# Patient Record
Sex: Female | Born: 1955 | Race: White | Hispanic: No | Marital: Single | Smoking: Never smoker
Health system: Southern US, Community
[De-identification: ages and names within clinical notes are randomized; demographics above are authoritative.]

## PROBLEM LIST (undated history)

## (undated) DIAGNOSIS — R32 Unspecified urinary incontinence: Secondary | ICD-10-CM

## (undated) DIAGNOSIS — Z853 Personal history of malignant neoplasm of breast: Secondary | ICD-10-CM

## (undated) DIAGNOSIS — E559 Vitamin D deficiency, unspecified: Secondary | ICD-10-CM

## (undated) DIAGNOSIS — E7800 Pure hypercholesterolemia, unspecified: Secondary | ICD-10-CM

## (undated) DIAGNOSIS — K219 Gastro-esophageal reflux disease without esophagitis: Secondary | ICD-10-CM

## (undated) DIAGNOSIS — J309 Allergic rhinitis, unspecified: Secondary | ICD-10-CM

## (undated) DIAGNOSIS — C801 Malignant (primary) neoplasm, unspecified: Secondary | ICD-10-CM

## (undated) DIAGNOSIS — M8580 Other specified disorders of bone density and structure, unspecified site: Secondary | ICD-10-CM

## (undated) DIAGNOSIS — L719 Rosacea, unspecified: Secondary | ICD-10-CM

## (undated) DIAGNOSIS — Z923 Personal history of irradiation: Secondary | ICD-10-CM

## (undated) DIAGNOSIS — D219 Benign neoplasm of connective and other soft tissue, unspecified: Secondary | ICD-10-CM

## (undated) DIAGNOSIS — Z780 Asymptomatic menopausal state: Secondary | ICD-10-CM

## (undated) DIAGNOSIS — D369 Benign neoplasm, unspecified site: Secondary | ICD-10-CM

## (undated) DIAGNOSIS — G540 Brachial plexus disorders: Secondary | ICD-10-CM

## (undated) DIAGNOSIS — N83201 Unspecified ovarian cyst, right side: Secondary | ICD-10-CM

## (undated) HISTORY — PX: WISDOM TOOTH EXTRACTION: SHX21

## (undated) HISTORY — DX: Asymptomatic menopausal state: Z78.0

## (undated) HISTORY — DX: Benign neoplasm, unspecified site: D36.9

## (undated) HISTORY — DX: Unspecified ovarian cyst, right side: N83.201

## (undated) HISTORY — DX: Personal history of malignant neoplasm of breast: Z85.3

## (undated) HISTORY — DX: Allergic rhinitis, unspecified: J30.9

## (undated) HISTORY — DX: Rosacea, unspecified: L71.9

## (undated) HISTORY — DX: Other specified disorders of bone density and structure, unspecified site: M85.80

## (undated) HISTORY — DX: Unspecified urinary incontinence: R32

## (undated) HISTORY — DX: Malignant (primary) neoplasm, unspecified: C80.1

## (undated) HISTORY — DX: Pure hypercholesterolemia, unspecified: E78.00

## (undated) HISTORY — DX: Gastro-esophageal reflux disease without esophagitis: K21.9

## (undated) HISTORY — DX: Brachial plexus disorders: G54.0

## (undated) HISTORY — DX: Vitamin D deficiency, unspecified: E55.9

## (undated) HISTORY — DX: Benign neoplasm of connective and other soft tissue, unspecified: D21.9

---

## 2001-11-07 DIAGNOSIS — Z9221 Personal history of antineoplastic chemotherapy: Secondary | ICD-10-CM

## 2001-11-07 DIAGNOSIS — C50919 Malignant neoplasm of unspecified site of unspecified female breast: Secondary | ICD-10-CM

## 2001-11-07 DIAGNOSIS — Z923 Personal history of irradiation: Secondary | ICD-10-CM

## 2001-11-07 HISTORY — DX: Malignant neoplasm of unspecified site of unspecified female breast: C50.919

## 2001-11-07 HISTORY — PX: BREAST LUMPECTOMY: SHX2

## 2001-11-07 HISTORY — DX: Personal history of antineoplastic chemotherapy: Z92.21

## 2001-11-07 HISTORY — DX: Personal history of irradiation: Z92.3

## 2002-06-19 ENCOUNTER — Other Ambulatory Visit: Admission: RE | Admit: 2002-06-19 | Discharge: 2002-06-19 | Payer: Self-pay | Admitting: Radiology

## 2002-06-19 ENCOUNTER — Encounter (INDEPENDENT_AMBULATORY_CARE_PROVIDER_SITE_OTHER): Payer: Self-pay | Admitting: *Deleted

## 2002-06-19 ENCOUNTER — Encounter: Payer: Self-pay | Admitting: Family Medicine

## 2002-06-19 ENCOUNTER — Encounter: Admission: RE | Admit: 2002-06-19 | Discharge: 2002-06-19 | Payer: Self-pay | Admitting: Family Medicine

## 2002-06-19 DIAGNOSIS — Z853 Personal history of malignant neoplasm of breast: Secondary | ICD-10-CM

## 2002-06-19 HISTORY — DX: Personal history of malignant neoplasm of breast: Z85.3

## 2002-07-05 ENCOUNTER — Encounter: Admission: RE | Admit: 2002-07-05 | Discharge: 2002-07-05 | Payer: Self-pay | Admitting: Surgery

## 2002-07-05 ENCOUNTER — Encounter: Payer: Self-pay | Admitting: Surgery

## 2002-07-09 ENCOUNTER — Encounter (INDEPENDENT_AMBULATORY_CARE_PROVIDER_SITE_OTHER): Payer: Self-pay | Admitting: Specialist

## 2002-07-09 ENCOUNTER — Ambulatory Visit (HOSPITAL_BASED_OUTPATIENT_CLINIC_OR_DEPARTMENT_OTHER): Admission: RE | Admit: 2002-07-09 | Discharge: 2002-07-10 | Payer: Self-pay | Admitting: Surgery

## 2002-07-09 ENCOUNTER — Encounter: Payer: Self-pay | Admitting: Surgery

## 2002-07-09 HISTORY — PX: MASTECTOMY PARTIAL / LUMPECTOMY W/ AXILLARY LYMPHADENECTOMY: SUR852

## 2002-07-10 ENCOUNTER — Ambulatory Visit: Admission: RE | Admit: 2002-07-10 | Discharge: 2002-07-29 | Payer: Self-pay | Admitting: Radiation Oncology

## 2002-07-25 ENCOUNTER — Ambulatory Visit (HOSPITAL_BASED_OUTPATIENT_CLINIC_OR_DEPARTMENT_OTHER): Admission: RE | Admit: 2002-07-25 | Discharge: 2002-07-25 | Payer: Self-pay | Admitting: Surgery

## 2002-07-25 ENCOUNTER — Encounter: Payer: Self-pay | Admitting: Surgery

## 2002-07-26 ENCOUNTER — Ambulatory Visit (HOSPITAL_COMMUNITY): Admission: RE | Admit: 2002-07-26 | Discharge: 2002-07-26 | Payer: Self-pay | Admitting: Oncology

## 2002-07-26 ENCOUNTER — Encounter: Payer: Self-pay | Admitting: Oncology

## 2002-07-30 ENCOUNTER — Encounter: Payer: Self-pay | Admitting: Oncology

## 2002-07-30 ENCOUNTER — Ambulatory Visit (HOSPITAL_COMMUNITY): Admission: RE | Admit: 2002-07-30 | Discharge: 2002-07-30 | Payer: Self-pay | Admitting: Oncology

## 2002-11-29 ENCOUNTER — Ambulatory Visit: Admission: RE | Admit: 2002-11-29 | Discharge: 2003-02-06 | Payer: Self-pay | Admitting: Radiation Oncology

## 2002-12-06 ENCOUNTER — Encounter: Admission: RE | Admit: 2002-12-06 | Discharge: 2002-12-06 | Payer: Self-pay | Admitting: Radiation Oncology

## 2003-06-24 ENCOUNTER — Encounter: Admission: RE | Admit: 2003-06-24 | Discharge: 2003-06-24 | Payer: Self-pay | Admitting: Oncology

## 2003-06-24 ENCOUNTER — Encounter: Payer: Self-pay | Admitting: Oncology

## 2003-08-25 ENCOUNTER — Ambulatory Visit (HOSPITAL_COMMUNITY): Admission: RE | Admit: 2003-08-25 | Discharge: 2003-08-25 | Payer: Self-pay | Admitting: Oncology

## 2003-08-25 ENCOUNTER — Encounter: Payer: Self-pay | Admitting: Oncology

## 2003-11-06 ENCOUNTER — Ambulatory Visit (HOSPITAL_COMMUNITY): Admission: RE | Admit: 2003-11-06 | Discharge: 2003-11-06 | Payer: Self-pay | Admitting: Oncology

## 2003-12-01 ENCOUNTER — Ambulatory Visit (HOSPITAL_COMMUNITY): Admission: RE | Admit: 2003-12-01 | Discharge: 2003-12-01 | Payer: Self-pay | Admitting: Oncology

## 2004-01-01 ENCOUNTER — Ambulatory Visit (HOSPITAL_BASED_OUTPATIENT_CLINIC_OR_DEPARTMENT_OTHER): Admission: RE | Admit: 2004-01-01 | Discharge: 2004-01-01 | Payer: Self-pay | Admitting: Surgery

## 2004-01-01 HISTORY — PX: PORT-A-CATH REMOVAL: SHX5289

## 2004-01-20 ENCOUNTER — Encounter: Admission: RE | Admit: 2004-01-20 | Discharge: 2004-01-20 | Payer: Self-pay | Admitting: Surgery

## 2004-05-19 ENCOUNTER — Ambulatory Visit (HOSPITAL_COMMUNITY): Admission: RE | Admit: 2004-05-19 | Discharge: 2004-05-19 | Payer: Self-pay | Admitting: Oncology

## 2004-06-24 ENCOUNTER — Encounter: Admission: RE | Admit: 2004-06-24 | Discharge: 2004-06-24 | Payer: Self-pay | Admitting: Oncology

## 2004-07-15 ENCOUNTER — Ambulatory Visit (HOSPITAL_COMMUNITY): Admission: RE | Admit: 2004-07-15 | Discharge: 2004-07-15 | Payer: Self-pay | Admitting: Oncology

## 2004-08-23 ENCOUNTER — Ambulatory Visit (HOSPITAL_COMMUNITY): Admission: RE | Admit: 2004-08-23 | Discharge: 2004-08-23 | Payer: Self-pay | Admitting: Obstetrics and Gynecology

## 2004-09-16 ENCOUNTER — Ambulatory Visit: Payer: Self-pay | Admitting: Oncology

## 2004-10-04 ENCOUNTER — Other Ambulatory Visit: Admission: RE | Admit: 2004-10-04 | Discharge: 2004-10-04 | Payer: Self-pay | Admitting: Obstetrics and Gynecology

## 2004-12-22 ENCOUNTER — Ambulatory Visit: Payer: Self-pay | Admitting: Oncology

## 2004-12-23 ENCOUNTER — Ambulatory Visit: Payer: Self-pay | Admitting: Oncology

## 2005-05-02 ENCOUNTER — Ambulatory Visit (HOSPITAL_COMMUNITY): Admission: RE | Admit: 2005-05-02 | Discharge: 2005-05-02 | Payer: Self-pay | Admitting: Obstetrics and Gynecology

## 2005-06-24 ENCOUNTER — Ambulatory Visit: Payer: Self-pay | Admitting: Oncology

## 2005-06-27 ENCOUNTER — Encounter: Admission: RE | Admit: 2005-06-27 | Discharge: 2005-06-27 | Payer: Self-pay | Admitting: Oncology

## 2005-06-28 ENCOUNTER — Ambulatory Visit (HOSPITAL_COMMUNITY): Admission: RE | Admit: 2005-06-28 | Discharge: 2005-06-28 | Payer: Self-pay | Admitting: Oncology

## 2005-07-27 ENCOUNTER — Encounter: Admission: RE | Admit: 2005-07-27 | Discharge: 2005-07-27 | Payer: Self-pay | Admitting: Oncology

## 2005-11-30 ENCOUNTER — Ambulatory Visit (HOSPITAL_COMMUNITY): Admission: RE | Admit: 2005-11-30 | Discharge: 2005-11-30 | Payer: Self-pay | Admitting: Obstetrics and Gynecology

## 2005-12-14 ENCOUNTER — Other Ambulatory Visit: Admission: RE | Admit: 2005-12-14 | Discharge: 2005-12-14 | Payer: Self-pay | Admitting: Obstetrics and Gynecology

## 2005-12-22 ENCOUNTER — Ambulatory Visit: Payer: Self-pay | Admitting: Oncology

## 2006-01-24 ENCOUNTER — Encounter: Admission: RE | Admit: 2006-01-24 | Discharge: 2006-01-24 | Payer: Self-pay | Admitting: Oncology

## 2006-06-23 ENCOUNTER — Ambulatory Visit (HOSPITAL_COMMUNITY): Admission: RE | Admit: 2006-06-23 | Discharge: 2006-06-23 | Payer: Self-pay | Admitting: Oncology

## 2006-06-28 ENCOUNTER — Encounter: Admission: RE | Admit: 2006-06-28 | Discharge: 2006-06-28 | Payer: Self-pay | Admitting: Oncology

## 2006-07-12 ENCOUNTER — Ambulatory Visit: Payer: Self-pay | Admitting: Oncology

## 2006-07-14 LAB — COMPREHENSIVE METABOLIC PANEL
BUN: 18 mg/dL (ref 6–23)
CO2: 28 mEq/L (ref 19–32)
Calcium: 9.1 mg/dL (ref 8.4–10.5)
Chloride: 99 mEq/L (ref 96–112)
Creatinine, Ser: 1.01 mg/dL (ref 0.40–1.20)
Glucose, Bld: 78 mg/dL (ref 70–99)
Total Bilirubin: 1 mg/dL (ref 0.3–1.2)

## 2006-07-14 LAB — CBC WITH DIFFERENTIAL/PLATELET
Eosinophils Absolute: 0.1 10*3/uL (ref 0.0–0.5)
MONO#: 0.5 10*3/uL (ref 0.1–0.9)
MONO%: 7.7 % (ref 0.0–13.0)
NEUT#: 4.2 10*3/uL (ref 1.5–6.5)
RBC: 4.37 10*6/uL (ref 3.70–5.32)
RDW: 12.3 % (ref 11.3–14.5)
WBC: 6.7 10*3/uL (ref 3.9–10.0)
lymph#: 1.9 10*3/uL (ref 0.9–3.3)

## 2006-07-14 LAB — LACTATE DEHYDROGENASE: LDH: 153 U/L (ref 94–250)

## 2006-07-14 LAB — CANCER ANTIGEN 27.29: CA 27.29: 23 U/mL (ref 0–39)

## 2006-07-17 ENCOUNTER — Ambulatory Visit (HOSPITAL_COMMUNITY): Admission: RE | Admit: 2006-07-17 | Discharge: 2006-07-17 | Payer: Self-pay | Admitting: Oncology

## 2006-12-26 ENCOUNTER — Other Ambulatory Visit: Admission: RE | Admit: 2006-12-26 | Discharge: 2006-12-26 | Payer: Self-pay | Admitting: Obstetrics and Gynecology

## 2007-01-17 ENCOUNTER — Ambulatory Visit: Payer: Self-pay | Admitting: Oncology

## 2007-01-22 LAB — COMPREHENSIVE METABOLIC PANEL
ALT: 14 U/L (ref 0–35)
AST: 23 U/L (ref 0–37)
Albumin: 4.4 g/dL (ref 3.5–5.2)
BUN: 15 mg/dL (ref 6–23)
CO2: 26 mEq/L (ref 19–32)
Calcium: 9.1 mg/dL (ref 8.4–10.5)
Chloride: 103 mEq/L (ref 96–112)
Creatinine, Ser: 0.96 mg/dL (ref 0.40–1.20)
Potassium: 3.9 mEq/L (ref 3.5–5.3)

## 2007-01-22 LAB — CBC WITH DIFFERENTIAL/PLATELET
BASO%: 0.4 % (ref 0.0–2.0)
Basophils Absolute: 0 10*3/uL (ref 0.0–0.1)
EOS%: 1.6 % (ref 0.0–7.0)
HCT: 37.2 % (ref 34.8–46.6)
HGB: 12.9 g/dL (ref 11.6–15.9)
MONO#: 0.5 10*3/uL (ref 0.1–0.9)
NEUT%: 59.6 % (ref 39.6–76.8)
RDW: 12.9 % (ref 11.3–14.5)
WBC: 5.5 10*3/uL (ref 3.9–10.0)
lymph#: 1.6 10*3/uL (ref 0.9–3.3)

## 2007-01-22 LAB — CANCER ANTIGEN 27.29: CA 27.29: 24 U/mL (ref 0–39)

## 2007-01-22 LAB — LACTATE DEHYDROGENASE: LDH: 153 U/L (ref 94–250)

## 2007-07-02 ENCOUNTER — Encounter: Admission: RE | Admit: 2007-07-02 | Discharge: 2007-07-02 | Payer: Self-pay | Admitting: Oncology

## 2007-07-17 ENCOUNTER — Ambulatory Visit (HOSPITAL_COMMUNITY): Admission: RE | Admit: 2007-07-17 | Discharge: 2007-07-17 | Payer: Self-pay | Admitting: Oncology

## 2007-07-23 ENCOUNTER — Ambulatory Visit: Payer: Self-pay | Admitting: Oncology

## 2007-07-25 LAB — CBC WITH DIFFERENTIAL/PLATELET
Basophils Absolute: 0 10*3/uL (ref 0.0–0.1)
EOS%: 1.8 % (ref 0.0–7.0)
HCT: 37.1 % (ref 34.8–46.6)
HGB: 13.2 g/dL (ref 11.6–15.9)
LYMPH%: 30 % (ref 14.0–48.0)
MCH: 31.2 pg (ref 26.0–34.0)
MONO#: 0.4 10*3/uL (ref 0.1–0.9)
NEUT%: 59.5 % (ref 39.6–76.8)
Platelets: 161 10*3/uL (ref 145–400)
lymph#: 1.6 10*3/uL (ref 0.9–3.3)

## 2007-07-25 LAB — COMPREHENSIVE METABOLIC PANEL
Albumin: 4.3 g/dL (ref 3.5–5.2)
BUN: 19 mg/dL (ref 6–23)
Calcium: 9.1 mg/dL (ref 8.4–10.5)
Chloride: 104 mEq/L (ref 96–112)
Glucose, Bld: 113 mg/dL — ABNORMAL HIGH (ref 70–99)
Potassium: 4.1 mEq/L (ref 3.5–5.3)

## 2007-10-17 ENCOUNTER — Encounter: Admission: RE | Admit: 2007-10-17 | Discharge: 2007-11-07 | Payer: Self-pay | Admitting: Family Medicine

## 2007-11-09 ENCOUNTER — Encounter: Admission: RE | Admit: 2007-11-09 | Discharge: 2007-12-17 | Payer: Self-pay | Admitting: Family Medicine

## 2008-01-22 ENCOUNTER — Ambulatory Visit: Payer: Self-pay | Admitting: Oncology

## 2008-01-24 LAB — CBC WITH DIFFERENTIAL/PLATELET
Eosinophils Absolute: 0.1 10*3/uL (ref 0.0–0.5)
MCHC: 35.2 g/dL (ref 32.0–36.0)
MCV: 88.3 fL (ref 81.0–101.0)
MONO%: 6.9 % (ref 0.0–13.0)
NEUT#: 3 10*3/uL (ref 1.5–6.5)
RBC: 4.46 10*6/uL (ref 3.70–5.32)
RDW: 12.7 % (ref 11.3–14.5)
WBC: 5 10*3/uL (ref 3.9–10.0)

## 2008-01-24 LAB — COMPREHENSIVE METABOLIC PANEL
BUN: 15 mg/dL (ref 6–23)
CO2: 25 mEq/L (ref 19–32)
Calcium: 9.4 mg/dL (ref 8.4–10.5)
Chloride: 103 mEq/L (ref 96–112)
Creatinine, Ser: 0.82 mg/dL (ref 0.40–1.20)
Total Bilirubin: 0.8 mg/dL (ref 0.3–1.2)

## 2008-01-24 LAB — LACTATE DEHYDROGENASE: LDH: 168 U/L (ref 94–250)

## 2008-01-24 LAB — CANCER ANTIGEN 27.29: CA 27.29: 24 U/mL (ref 0–39)

## 2008-02-04 ENCOUNTER — Other Ambulatory Visit: Admission: RE | Admit: 2008-02-04 | Discharge: 2008-02-04 | Payer: Self-pay | Admitting: Obstetrics and Gynecology

## 2008-07-02 ENCOUNTER — Encounter: Admission: RE | Admit: 2008-07-02 | Discharge: 2008-07-02 | Payer: Self-pay | Admitting: Oncology

## 2008-12-01 ENCOUNTER — Encounter: Admission: RE | Admit: 2008-12-01 | Discharge: 2008-12-31 | Payer: Self-pay | Admitting: Family Medicine

## 2009-01-21 ENCOUNTER — Ambulatory Visit: Payer: Self-pay | Admitting: Oncology

## 2009-01-23 ENCOUNTER — Ambulatory Visit (HOSPITAL_COMMUNITY): Admission: RE | Admit: 2009-01-23 | Discharge: 2009-01-23 | Payer: Self-pay | Admitting: Oncology

## 2009-01-23 LAB — CBC WITH DIFFERENTIAL/PLATELET
BASO%: 0.3 % (ref 0.0–2.0)
Basophils Absolute: 0 10*3/uL (ref 0.0–0.1)
EOS%: 1.1 % (ref 0.0–7.0)
HCT: 40.5 % (ref 34.8–46.6)
HGB: 13.7 g/dL (ref 11.6–15.9)
MCH: 30.5 pg (ref 25.1–34.0)
MCHC: 33.8 g/dL (ref 31.5–36.0)
MONO#: 0.5 10*3/uL (ref 0.1–0.9)
RDW: 12.5 % (ref 11.2–14.5)
WBC: 6.3 10*3/uL (ref 3.9–10.3)
lymph#: 1.1 10*3/uL (ref 0.9–3.3)

## 2009-01-26 LAB — COMPREHENSIVE METABOLIC PANEL
ALT: 15 U/L (ref 0–35)
AST: 24 U/L (ref 0–37)
Albumin: 4.6 g/dL (ref 3.5–5.2)
CO2: 27 mEq/L (ref 19–32)
Calcium: 9.3 mg/dL (ref 8.4–10.5)
Chloride: 103 mEq/L (ref 96–112)
Potassium: 3.7 mEq/L (ref 3.5–5.3)

## 2009-01-26 LAB — LACTATE DEHYDROGENASE: LDH: 174 U/L (ref 94–250)

## 2009-07-03 ENCOUNTER — Encounter: Admission: RE | Admit: 2009-07-03 | Discharge: 2009-07-03 | Payer: Self-pay | Admitting: Oncology

## 2009-07-15 ENCOUNTER — Other Ambulatory Visit: Admission: RE | Admit: 2009-07-15 | Discharge: 2009-07-15 | Payer: Self-pay | Admitting: Obstetrics and Gynecology

## 2009-08-05 ENCOUNTER — Ambulatory Visit: Payer: Self-pay | Admitting: Oncology

## 2009-08-07 LAB — CBC WITH DIFFERENTIAL/PLATELET
EOS%: 1.6 % (ref 0.0–7.0)
HGB: 13.9 g/dL (ref 11.6–15.9)
MCHC: 34.8 g/dL (ref 31.5–36.0)
MCV: 89.7 fL (ref 79.5–101.0)
MONO#: 0.5 10*3/uL (ref 0.1–0.9)
MONO%: 6.7 % (ref 0.0–14.0)
NEUT#: 4.5 10*3/uL (ref 1.5–6.5)
NEUT%: 66 % (ref 38.4–76.8)
RBC: 4.47 10*6/uL (ref 3.70–5.45)
WBC: 6.8 10*3/uL (ref 3.9–10.3)
lymph#: 1.7 10*3/uL (ref 0.9–3.3)

## 2009-08-08 LAB — COMPREHENSIVE METABOLIC PANEL
AST: 21 U/L (ref 0–37)
Alkaline Phosphatase: 71 U/L (ref 39–117)
CO2: 26 mEq/L (ref 19–32)
Creatinine, Ser: 1.03 mg/dL (ref 0.40–1.20)
Glucose, Bld: 81 mg/dL (ref 70–99)
Potassium: 3.8 mEq/L (ref 3.5–5.3)
Sodium: 140 mEq/L (ref 135–145)
Total Bilirubin: 0.7 mg/dL (ref 0.3–1.2)

## 2009-08-08 LAB — LACTATE DEHYDROGENASE: LDH: 167 U/L (ref 94–250)

## 2009-08-08 LAB — VITAMIN D 25 HYDROXY (VIT D DEFICIENCY, FRACTURES): Vit D, 25-Hydroxy: 42 ng/mL (ref 30–89)

## 2010-07-06 ENCOUNTER — Encounter: Admission: RE | Admit: 2010-07-06 | Discharge: 2010-07-06 | Payer: Self-pay | Admitting: Oncology

## 2010-08-05 ENCOUNTER — Ambulatory Visit: Payer: Self-pay | Admitting: Oncology

## 2010-08-09 LAB — CBC WITH DIFFERENTIAL/PLATELET
EOS%: 1.4 % (ref 0.0–7.0)
Eosinophils Absolute: 0.1 10*3/uL (ref 0.0–0.5)
HCT: 38.9 % (ref 34.8–46.6)
HGB: 13.4 g/dL (ref 11.6–15.9)
MCH: 31.5 pg (ref 25.1–34.0)
MONO#: 0.6 10*3/uL (ref 0.1–0.9)
Platelets: 157 10*3/uL (ref 145–400)
RDW: 12.4 % (ref 11.2–14.5)
lymph#: 1.9 10*3/uL (ref 0.9–3.3)

## 2010-08-10 LAB — COMPREHENSIVE METABOLIC PANEL
ALT: 18 U/L (ref 0–35)
AST: 25 U/L (ref 0–37)
Albumin: 4.6 g/dL (ref 3.5–5.2)
Alkaline Phosphatase: 63 U/L (ref 39–117)
BUN: 16 mg/dL (ref 6–23)
Sodium: 137 mEq/L (ref 135–145)
Total Bilirubin: 0.6 mg/dL (ref 0.3–1.2)

## 2010-11-27 ENCOUNTER — Other Ambulatory Visit: Payer: Self-pay | Admitting: Oncology

## 2010-11-27 DIAGNOSIS — Z0000 Encounter for general adult medical examination without abnormal findings: Secondary | ICD-10-CM

## 2010-11-28 ENCOUNTER — Encounter: Payer: Self-pay | Admitting: Oncology

## 2011-03-25 NOTE — Op Note (Signed)
NAME:  Allison Hernandez, Allison Hernandez                          ACCOUNT NO.:  [id number]   MEDICAL RECORD NO.:  [id number]                   PATIENT TYPE:  AMB   LOCATION:  DSC                                  FACILITY:  [facility]   PHYSICIAN:  Currie Paris, M.D.           DATE OF BIRTH:  15-Mar-1956   DATE OF PROCEDURE:  01/01/2004  DATE OF DISCHARGE:                                 OPERATIVE REPORT   PREOPERATIVE DIAGNOSIS:  Unneeded Port-a-Cath.   POSTOPERATIVE DIAGNOSIS:  Unneeded Port-a-Cath.   PROCEDURE:  Aspiration and removal of Port-a-Cath.   SURGEON:  Currie Paris, M.D.   ANESTHESIA:  Local.   CLINICAL HISTORY:  This patient is a 55 year old lady who has now completed  her chemo and wished to have her Port-a-Cath removed.   DESCRIPTION OF PROCEDURE:  The patient was seen in minor procedure room.  She had no further questions about the surgery.  The area over the Port-a-  Cath was prepped with an alcohol sponge and anesthetized with 1% Xylocaine  with epi using about 10 cc.  We waited about 10 minutes and then prepped the  area with Betadine.  The old scar was opened and the Port-a-Cath reservoir  identified and the holding sutures cut.  Port-a-Cath reservoir was  manipulated out of its pocket.  A figure-of-eight Vicryl was placed around  the Port-a-Cath tubing and the tubing was pulled out and the figure-of-eight  suture tied down.   The incision was then closed in layers with 3-0 Vicryl followed by 4-0  Monocryl subcuticular and Dermabond.  Patient tolerated the procedure well.  All counts were correct.                                               Currie Paris, M.D.    CJS/MEDQ  D:  01/01/2004  T:  01/01/2004  Job:  [id number]

## 2011-04-28 ENCOUNTER — Other Ambulatory Visit (HOSPITAL_COMMUNITY)
Admission: RE | Admit: 2011-04-28 | Discharge: 2011-04-28 | Disposition: A | Discharge status: Home / Self Care | Payer: BC Managed Care – PPO | Source: Ambulatory Visit | Attending: Obstetrics and Gynecology | Admitting: Obstetrics and Gynecology

## 2011-04-28 ENCOUNTER — Other Ambulatory Visit: Payer: Self-pay | Admitting: Obstetrics and Gynecology

## 2011-04-28 DIAGNOSIS — Z01419 Encounter for gynecological examination (general) (routine) without abnormal findings: Secondary | ICD-10-CM | POA: Insufficient documentation

## 2011-07-19 ENCOUNTER — Ambulatory Visit
Admission: RE | Admit: 2011-07-19 | Discharge: 2011-07-19 | Disposition: A | Discharge status: Home / Self Care | Payer: BC Managed Care – PPO | Source: Ambulatory Visit | Attending: Oncology | Admitting: Oncology

## 2011-07-19 DIAGNOSIS — Z0000 Encounter for general adult medical examination without abnormal findings: Secondary | ICD-10-CM

## 2011-08-24 ENCOUNTER — Other Ambulatory Visit: Payer: Self-pay | Admitting: Oncology

## 2011-08-24 ENCOUNTER — Encounter (HOSPITAL_BASED_OUTPATIENT_CLINIC_OR_DEPARTMENT_OTHER): Payer: BC Managed Care – PPO | Admitting: Oncology

## 2011-08-24 DIAGNOSIS — Z171 Estrogen receptor negative status [ER-]: Secondary | ICD-10-CM

## 2011-08-24 DIAGNOSIS — C50419 Malignant neoplasm of upper-outer quadrant of unspecified female breast: Secondary | ICD-10-CM

## 2011-08-24 LAB — CBC WITH DIFFERENTIAL/PLATELET
EOS%: 1 % (ref 0.0–7.0)
Eosinophils Absolute: 0.1 10*3/uL (ref 0.0–0.5)
LYMPH%: 25.8 % (ref 14.0–49.7)
MCH: 30.9 pg (ref 25.1–34.0)
MCV: 90.6 fL (ref 79.5–101.0)
MONO%: 7.3 % (ref 0.0–14.0)
NEUT#: 4.4 10*3/uL (ref 1.5–6.5)
Platelets: 142 10*3/uL — ABNORMAL LOW (ref 145–400)
RBC: 4.32 10*6/uL (ref 3.70–5.45)
RDW: 12.9 % (ref 11.2–14.5)

## 2011-08-24 LAB — COMPREHENSIVE METABOLIC PANEL
AST: 23 U/L (ref 0–37)
Alkaline Phosphatase: 59 U/L (ref 39–117)
BUN: 18 mg/dL (ref 6–23)
Glucose, Bld: 95 mg/dL (ref 70–99)
Potassium: 4 mEq/L (ref 3.5–5.3)
Sodium: 138 mEq/L (ref 135–145)
Total Bilirubin: 0.6 mg/dL (ref 0.3–1.2)
Total Protein: 6.8 g/dL (ref 6.0–8.3)

## 2011-08-26 ENCOUNTER — Encounter (HOSPITAL_BASED_OUTPATIENT_CLINIC_OR_DEPARTMENT_OTHER): Payer: BC Managed Care – PPO | Admitting: Oncology

## 2011-08-26 ENCOUNTER — Other Ambulatory Visit: Payer: Self-pay | Admitting: Oncology

## 2011-08-26 DIAGNOSIS — Z1231 Encounter for screening mammogram for malignant neoplasm of breast: Secondary | ICD-10-CM

## 2011-08-26 DIAGNOSIS — C50419 Malignant neoplasm of upper-outer quadrant of unspecified female breast: Secondary | ICD-10-CM

## 2011-08-26 DIAGNOSIS — Z171 Estrogen receptor negative status [ER-]: Secondary | ICD-10-CM

## 2011-09-03 ENCOUNTER — Encounter (INDEPENDENT_AMBULATORY_CARE_PROVIDER_SITE_OTHER): Payer: Self-pay | Admitting: Surgery

## 2012-07-19 ENCOUNTER — Ambulatory Visit
Admission: RE | Admit: 2012-07-19 | Discharge: 2012-07-19 | Disposition: A | Discharge status: Home / Self Care | Payer: BC Managed Care – PPO | Source: Ambulatory Visit | Attending: Oncology | Admitting: Oncology

## 2012-07-19 DIAGNOSIS — Z1231 Encounter for screening mammogram for malignant neoplasm of breast: Secondary | ICD-10-CM

## 2012-08-15 ENCOUNTER — Ambulatory Visit (INDEPENDENT_AMBULATORY_CARE_PROVIDER_SITE_OTHER): Payer: BC Managed Care – PPO | Admitting: Internal Medicine

## 2012-08-15 ENCOUNTER — Encounter: Payer: Self-pay | Admitting: Internal Medicine

## 2012-08-15 VITALS — BP 122/80 | HR 96 | Temp 98.0°F | Resp 18 | Wt 128.0 lb

## 2012-08-15 DIAGNOSIS — N3941 Urge incontinence: Secondary | ICD-10-CM

## 2012-08-15 DIAGNOSIS — Z8639 Personal history of other endocrine, nutritional and metabolic disease: Secondary | ICD-10-CM

## 2012-08-15 DIAGNOSIS — G5760 Lesion of plantar nerve, unspecified lower limb: Secondary | ICD-10-CM

## 2012-08-15 DIAGNOSIS — C50919 Malignant neoplasm of unspecified site of unspecified female breast: Secondary | ICD-10-CM

## 2012-08-15 DIAGNOSIS — Z780 Asymptomatic menopausal state: Secondary | ICD-10-CM

## 2012-08-15 DIAGNOSIS — M949 Disorder of cartilage, unspecified: Secondary | ICD-10-CM

## 2012-08-15 DIAGNOSIS — Z8669 Personal history of other diseases of the nervous system and sense organs: Secondary | ICD-10-CM

## 2012-08-15 DIAGNOSIS — L719 Rosacea, unspecified: Secondary | ICD-10-CM

## 2012-08-15 DIAGNOSIS — M8580 Other specified disorders of bone density and structure, unspecified site: Secondary | ICD-10-CM

## 2012-08-15 NOTE — Progress Notes (Signed)
  Subjective:    Patient ID: Allison Hernandez, female    DOB: [birth year], 56 y.o.   MRN: [id number]  HPI  New pt. Here for first visit.  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  Overall Nazarene is doing well .  She needs her Vitamin D level checked  Allergies  Allergen Reactions  . Chamomile Itching   Past Medical History  Diagnosis Date  . 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 Osteopenia   . Urinary incontinence   . Thoracic outlet syndrome   . Rosacea   . Vitamin D deficiency disease    Past Surgical History  Procedure Date  . Mastectomy partial / lumpectomy w/ axillary lymphadenectomy 07/09/2002    Left - Dr Jamey Ripa  . Port-a-cath removal 01/01/2004    Dr Jamey Ripa   History   Social History  . Marital Status: Single    Spouse Name: N/A    Number of Children: N/A  . Years of Education: N/A   Occupational History  . Not on file.   Social History Main Topics  . Smoking status: Never Smoker   . Smokeless tobacco: Never Used  . Alcohol Use: 0.6 oz/week    1 Glasses of wine per week  . Drug Use: No  . Sexually Active: No   Other Topics Concern  . Not on file   Social History Narrative  . No narrative on file   Family History  Problem Relation Age of Onset  . Hyperlipidemia Mother   . Heart disease Mother   . Diabetes Father   . Stroke Father   . Hypertension Father   . Cancer Paternal Aunt   . Stroke Maternal Grandmother   . Heart disease Maternal Grandmother   . Arthritis Maternal Grandmother   . Diabetes Maternal Grandmother    Patient Active Problem List  Diagnosis  . Hx Breast cancer, IDC, Left, Stage II, triple -   No current outpatient prescriptions on file prior to visit.      Review of Systems    see HPI Objective:   Physical Exam Physical Exam  Nursing note and vitals reviewed.    Constitutional: She is oriented to person, place, and time. She appears well-developed and well-nourished.  HENT:  Head: Normocephalic and atraumatic.  Cardiovascular: Normal rate and regular rhythm. Exam reveals no gallop and no friction rub.  No murmur heard.  Pulmonary/Chest: Breath sounds normal. She has no wheezes. She has no rales.  Neurological: She is alert and oriented to person, place, and time.  Skin: Skin is warm and dry.  Psychiatric: She has a normal mood and affect. Her behavior is normal.       Assessment & Plan:  History of vitamin D deficiency  Will check levels  Menopause  History of osteopenia  Counsled to take calcium 1200-1500 mg and vitamin D [phone] units  Rosacea  History of urge incontinence  History of Morton's neuroma

## 2012-08-16 ENCOUNTER — Encounter: Payer: Self-pay | Admitting: Internal Medicine

## 2012-08-16 ENCOUNTER — Ambulatory Visit: Payer: BC Managed Care – PPO | Admitting: Internal Medicine

## 2012-08-16 DIAGNOSIS — Z780 Asymptomatic menopausal state: Secondary | ICD-10-CM | POA: Insufficient documentation

## 2012-08-16 DIAGNOSIS — G5760 Lesion of plantar nerve, unspecified lower limb: Secondary | ICD-10-CM | POA: Insufficient documentation

## 2012-08-16 DIAGNOSIS — M8580 Other specified disorders of bone density and structure, unspecified site: Secondary | ICD-10-CM | POA: Insufficient documentation

## 2012-08-16 DIAGNOSIS — N3941 Urge incontinence: Secondary | ICD-10-CM | POA: Insufficient documentation

## 2012-08-16 DIAGNOSIS — Z8669 Personal history of other diseases of the nervous system and sense organs: Secondary | ICD-10-CM | POA: Insufficient documentation

## 2012-08-16 DIAGNOSIS — Z8639 Personal history of other endocrine, nutritional and metabolic disease: Secondary | ICD-10-CM | POA: Insufficient documentation

## 2012-08-16 LAB — CBC WITH DIFFERENTIAL/PLATELET
Basophils Absolute: 0 10*3/uL (ref 0.0–0.1)
Eosinophils Relative: 2 % (ref 0–5)
HCT: 41.7 % (ref 36.0–46.0)
Lymphocytes Relative: 29 % (ref 12–46)
Lymphs Abs: 1.5 10*3/uL (ref 0.7–4.0)
MCV: 88 fL (ref 78.0–100.0)
Neutro Abs: 3 10*3/uL (ref 1.7–7.7)
Platelets: 174 10*3/uL (ref 150–400)
RBC: 4.74 MIL/uL (ref 3.87–5.11)
WBC: 5.1 10*3/uL (ref 4.0–10.5)

## 2012-08-16 LAB — COMPREHENSIVE METABOLIC PANEL
ALT: 17 U/L (ref 0–35)
AST: 26 U/L (ref 0–37)
Albumin: 4.6 g/dL (ref 3.5–5.2)
CO2: 27 mEq/L (ref 19–32)
Calcium: 9.8 mg/dL (ref 8.4–10.5)
Chloride: 103 mEq/L (ref 96–112)
Creat: 0.97 mg/dL (ref 0.50–1.10)
Potassium: 4.4 mEq/L (ref 3.5–5.3)
Total Protein: 7.3 g/dL (ref 6.0–8.3)

## 2012-08-16 LAB — LIPID PANEL
Cholesterol: 233 mg/dL — ABNORMAL HIGH (ref 0–200)
Total CHOL/HDL Ratio: 3.2 Ratio

## 2012-08-16 LAB — TSH: TSH: 1.313 u[IU]/mL (ref 0.350–4.500)

## 2012-08-17 LAB — VITAMIN D 25 HYDROXY (VIT D DEFICIENCY, FRACTURES): Vit D, 25-Hydroxy: 62 ng/mL (ref 30–89)

## 2012-08-18 DIAGNOSIS — L719 Rosacea, unspecified: Secondary | ICD-10-CM | POA: Insufficient documentation

## 2012-08-22 ENCOUNTER — Encounter: Payer: Self-pay | Admitting: *Deleted

## 2012-08-28 ENCOUNTER — Telehealth: Payer: Self-pay | Admitting: *Deleted

## 2012-08-28 NOTE — Telephone Encounter (Signed)
Labs mailed to pt home address.

## 2012-11-06 ENCOUNTER — Other Ambulatory Visit (HOSPITAL_COMMUNITY)
Admission: RE | Admit: 2012-11-06 | Discharge: 2012-11-06 | Disposition: A | Discharge status: Home / Self Care | Payer: BC Managed Care – PPO | Source: Ambulatory Visit | Attending: Obstetrics and Gynecology | Admitting: Obstetrics and Gynecology

## 2012-11-06 ENCOUNTER — Other Ambulatory Visit: Payer: Self-pay | Admitting: Obstetrics and Gynecology

## 2012-11-06 DIAGNOSIS — Z01419 Encounter for gynecological examination (general) (routine) without abnormal findings: Secondary | ICD-10-CM | POA: Insufficient documentation

## 2012-11-27 ENCOUNTER — Encounter: Payer: Self-pay | Admitting: Internal Medicine

## 2012-11-27 ENCOUNTER — Ambulatory Visit (INDEPENDENT_AMBULATORY_CARE_PROVIDER_SITE_OTHER): Payer: BC Managed Care – PPO | Admitting: Internal Medicine

## 2012-11-27 VITALS — BP 105/63 | HR 81 | Temp 97.4°F | Resp 18 | Wt 124.0 lb

## 2012-11-27 DIAGNOSIS — Z139 Encounter for screening, unspecified: Secondary | ICD-10-CM

## 2012-11-27 DIAGNOSIS — M8580 Other specified disorders of bone density and structure, unspecified site: Secondary | ICD-10-CM

## 2012-11-27 DIAGNOSIS — K635 Polyp of colon: Secondary | ICD-10-CM | POA: Insufficient documentation

## 2012-11-27 DIAGNOSIS — Z853 Personal history of malignant neoplasm of breast: Secondary | ICD-10-CM

## 2012-11-27 DIAGNOSIS — Z780 Asymptomatic menopausal state: Secondary | ICD-10-CM

## 2012-11-27 DIAGNOSIS — M949 Disorder of cartilage, unspecified: Secondary | ICD-10-CM

## 2012-11-27 DIAGNOSIS — D126 Benign neoplasm of colon, unspecified: Secondary | ICD-10-CM

## 2012-11-27 DIAGNOSIS — E785 Hyperlipidemia, unspecified: Secondary | ICD-10-CM

## 2012-11-27 DIAGNOSIS — L719 Rosacea, unspecified: Secondary | ICD-10-CM

## 2012-11-27 DIAGNOSIS — Z8639 Personal history of other endocrine, nutritional and metabolic disease: Secondary | ICD-10-CM

## 2012-11-27 DIAGNOSIS — N3941 Urge incontinence: Secondary | ICD-10-CM

## 2012-11-27 LAB — POCT URINALYSIS DIPSTICK
Bilirubin, UA: NEGATIVE
Blood, UA: NEGATIVE
Glucose, UA: NEGATIVE
Leukocytes, UA: NEGATIVE
Nitrite, UA: NEGATIVE
Urobilinogen, UA: NEGATIVE

## 2012-11-27 NOTE — Patient Instructions (Addendum)
See me as needed 

## 2012-11-27 NOTE — Progress Notes (Signed)
Subjective:    Patient ID: Allison Hernandez, female    DOB: Aug 22, 1956, 57 y.o.   MRN: [id number]  HPI Allison Hernandez is here for CPE  Allergies  Allergen Reactions  . Chamomile Itching  . Benadryl (Diphenhydramine Hcl)    Past Medical History  Diagnosis Date  . 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 Osteopenia   . Urinary incontinence   . Thoracic outlet syndrome   . Rosacea   . Vitamin D deficiency disease    Past Surgical History  Procedure Date  . Mastectomy partial / lumpectomy w/ axillary lymphadenectomy 07/09/2002    Left - Dr Jamey Ripa  . Port-a-cath removal 01/01/2004    Dr Jamey Ripa   History   Social History  . Marital Status: Single    Spouse Name: N/A    Number of Children: N/A  . Years of Education: N/A   Occupational History  . Not on file.   Social History Main Topics  . Smoking status: Never Smoker   . Smokeless tobacco: Never Used  . Alcohol Use: 0.6 oz/week    1 Glasses of wine per week  . Drug Use: No  . Sexually Active: No   Other Topics Concern  . Not on file   Social History Narrative  . No narrative on file   Family History  Problem Relation Age of Onset  . Hyperlipidemia Mother   . Heart disease Mother   . Diabetes Father   . Stroke Father   . Hypertension Father   . Cancer Paternal Aunt   . Stroke Maternal Grandmother   . Heart disease Maternal Grandmother   . Arthritis Maternal Grandmother   . Diabetes Maternal Grandmother    Patient Active Problem List  Diagnosis  . Hx Breast cancer, IDC, Left, Stage II, triple -  . Menopause  . Osteopenia  . Urge incontinence  . History of thoracic outlet syndrome  . Morton's neuroma  . History of vitamin D deficiency  . Rosacea   Current Outpatient Prescriptions on File Prior to Visit  Medication Sig Dispense Refill  . MULTIPLE VITAMIN PO Take 1 tablet by mouth.      . Sulfacetamide in Bakuchiol (SODIUM SULFACETAMIDE W[place] EX) Apply topically.      . Sulfur 5 % LOTN  Apply topically.           Review of Systems     Objective:   Physical Exam  Physical Exam  Nursing note and vitals reviewed.  Constitutional: She is oriented to person, place, and time. She appears well-developed and well-nourished.  HENT:  Head: Normocephalic and atraumatic.  Right Ear: Tympanic membrane and ear canal normal. No drainage. Tympanic membrane is not injected and not erythematous.  Left Ear: Tympanic membrane and ear canal normal. No drainage. Tympanic membrane is not injected and not erythematous.  Nose: Nose normal. Right sinus exhibits no maxillary sinus tenderness and no frontal sinus tenderness. Left sinus exhibits no maxillary sinus tenderness and no frontal sinus tenderness.  Mouth/Throat: Oropharynx is clear and moist. No oral lesions. No oropharyngeal exudate.  Eyes: Conjunctivae and EOM are normal. Pupils are equal, round, and reactive to light.  Neck: Normal range of motion. Neck supple. No JVD present. Carotid bruit is not present. No mass and no thyromegaly present.  Cardiovascular: Normal rate, regular rhythm, S1 normal, S2 normal and intact distal pulses. Exam reveals no gallop and no friction rub.  No murmur heard.  Pulses:  Carotid pulses are  2+ on the right side, and 2+ on the left side.  Dorsalis pedis pulses are 2+ on the right side, and 2+ on the left side.  No carotid bruit. No LE edema  Pulmonary/Chest: Breath sounds normal. She has no wheezes. She has no rales. She exhibits no tenderness.  Abdominal: Soft. Bowel sounds are normal. She exhibits no distension and no mass. There is no hepatosplenomegaly. There is no tenderness. There is no CVA tenderness.  Musculoskeletal: Normal range of motion.  No active synovitis to joints.  Lymphadenopathy:  She has no cervical adenopathy.  She has no axillary adenopathy.  Right: No inguinal and no supraclavicular adenopathy present.  Left: No inguinal and no supraclavicular adenopathy present.    Neurological: She is alert and oriented to person, place, and time. She has normal strength and normal reflexes. She displays no tremor. No cranial nerve deficit or sensory deficit. Coordination and gait normal.  Skin: Skin is warm and dry. No rash noted. No cyanosis. Nails show no clubbing.  Psychiatric: She has a normal mood and affect. Her speech is normal and behavior is normal. Cognition and memory are normal.           Assessment & Plan:  Health Maintenance  See scanned HM sheet  UTD with vaccines  Zostavax info given.   Had colonoscopy Dr. Evette Cristal 2011 per pt.  She is on a 5 year schedule as she had polyps  Hyperlipidemia  Framingham risk  3.5%  Will recheck fasting lipids in am  EKG today  NSR no acute changes  Osteopenia  She declines Dexa now  Urge incontinence  She wishes no meds.  Offered referral to PT and she will thing about it  Menopause  Stage II breast CA  Her cousin is getting the BRCA test.  If negative pt does not wish test,  But she will call ffice if cousin tests positive  See me as needed

## 2012-11-29 LAB — LIPID PANEL
Cholesterol: 197 mg/dL (ref 0–200)
VLDL: 14 mg/dL (ref 0–40)

## 2012-12-06 ENCOUNTER — Encounter: Payer: Self-pay | Admitting: *Deleted

## 2013-07-02 ENCOUNTER — Other Ambulatory Visit: Payer: Self-pay

## 2013-07-02 ENCOUNTER — Telehealth: Payer: Self-pay | Admitting: *Deleted

## 2013-07-02 DIAGNOSIS — Z1231 Encounter for screening mammogram for malignant neoplasm of breast: Secondary | ICD-10-CM

## 2013-07-02 NOTE — Telephone Encounter (Signed)
Allison Hernandez would like your opinion of whether she should have a regular mammo or a 3-D mammo with her history.  She can be reached at [phone] (her work); she said we can leave a message if she does not answer.

## 2013-07-02 NOTE — Telephone Encounter (Signed)
Advised pt per Dr Constance Goltz that a 3 D mammogram is recommended for her also gave her the name and number for Dr Elmer Picker

## 2013-07-02 NOTE — Telephone Encounter (Signed)
[facility]  Call [place] and tell her that I recommend a 3-D mm.  She will likely have a higher co-pay depending on her insurance guidelines for this.

## 2013-07-31 ENCOUNTER — Ambulatory Visit
Admission: RE | Admit: 2013-07-31 | Discharge: 2013-07-31 | Disposition: A | Discharge status: Home / Self Care | Payer: BC Managed Care – PPO | Source: Ambulatory Visit

## 2013-07-31 DIAGNOSIS — Z1231 Encounter for screening mammogram for malignant neoplasm of breast: Secondary | ICD-10-CM

## 2013-08-16 ENCOUNTER — Ambulatory Visit (INDEPENDENT_AMBULATORY_CARE_PROVIDER_SITE_OTHER): Payer: BC Managed Care – PPO | Admitting: *Deleted

## 2013-08-16 ENCOUNTER — Ambulatory Visit: Payer: BC Managed Care – PPO

## 2013-08-16 DIAGNOSIS — Z23 Encounter for immunization: Secondary | ICD-10-CM

## 2013-08-16 NOTE — Progress Notes (Signed)
Patient ID: Allison Hernandez, female   DOB: 05-Mar-1956, 57 y.o.   MRN: [id number] [place] is here for her annual flu vaccination, she is feeling well today with no fevers.

## 2013-08-16 NOTE — Addendum Note (Signed)
Addended by: Mathews Robinsons on: 08/16/2013 09:28 AM   Modules accepted: Level of Service

## 2013-10-10 ENCOUNTER — Encounter (INDEPENDENT_AMBULATORY_CARE_PROVIDER_SITE_OTHER): Payer: BC Managed Care – PPO | Admitting: Ophthalmology

## 2013-10-10 DIAGNOSIS — H35719 Central serous chorioretinopathy, unspecified eye: Secondary | ICD-10-CM

## 2013-10-10 DIAGNOSIS — H43819 Vitreous degeneration, unspecified eye: Secondary | ICD-10-CM

## 2013-10-10 DIAGNOSIS — H35379 Puckering of macula, unspecified eye: Secondary | ICD-10-CM

## 2013-10-10 DIAGNOSIS — H35349 Macular cyst, hole, or pseudohole, unspecified eye: Secondary | ICD-10-CM

## 2013-11-20 ENCOUNTER — Telehealth: Payer: Self-pay | Admitting: *Deleted

## 2013-11-20 MED ORDER — OSELTAMIVIR PHOSPHATE 75 MG PO CAPS: ORAL_CAPSULE | ORAL | Status: DC

## 2013-11-20 NOTE — Telephone Encounter (Signed)
Needs Tamiflu.  A member in her household has tested positive.  [facility]

## 2014-02-19 ENCOUNTER — Ambulatory Visit (HOSPITAL_BASED_OUTPATIENT_CLINIC_OR_DEPARTMENT_OTHER)
Admission: RE | Admit: 2014-02-19 | Discharge: 2014-02-19 | Disposition: A | Discharge status: Home / Self Care | Payer: BC Managed Care – PPO | Source: Ambulatory Visit | Attending: Internal Medicine | Admitting: Internal Medicine

## 2014-02-19 ENCOUNTER — Encounter: Payer: Self-pay | Admitting: Internal Medicine

## 2014-02-19 ENCOUNTER — Ambulatory Visit (INDEPENDENT_AMBULATORY_CARE_PROVIDER_SITE_OTHER): Payer: BC Managed Care – PPO | Admitting: Internal Medicine

## 2014-02-19 VITALS — BP 122/65 | HR 102 | Temp 98.1°F | Resp 18 | Wt 129.0 lb

## 2014-02-19 DIAGNOSIS — R05 Cough: Secondary | ICD-10-CM

## 2014-02-19 DIAGNOSIS — R059 Cough, unspecified: Secondary | ICD-10-CM

## 2014-02-19 DIAGNOSIS — J984 Other disorders of lung: Secondary | ICD-10-CM | POA: Insufficient documentation

## 2014-02-19 MED ORDER — AZITHROMYCIN 250 MG PO TABS
ORAL_TABLET | ORAL | Status: DC
Start: 2014-02-19 — End: 2014-03-05

## 2014-02-19 MED ORDER — OSELTAMIVIR PHOSPHATE 75 MG PO CAPS: 75.0000 mg | ORAL_CAPSULE | Freq: Two times a day (BID) | ORAL | Status: DC

## 2014-02-19 NOTE — Progress Notes (Signed)
Subjective:    Patient ID: Allison Hernandez, female    DOB: Mar 24, 1956, 58 y.o.   MRN: [id number]  HPI  Haydn is here for acute visit.   Began with head cold and sore throat 10  Days ago  Felt better but then had cough, myalgias low grade fever 5 days ago.   She did have an influenza vaccine.   No chest pain  Cough productive at times of white mucous.  Feeling some better now.   Allergies  Allergen Reactions  . Chamomile Itching  . Benadryl [Diphenhydramine Hcl]    Past Medical History  Diagnosis Date  . 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 Osteopenia   . Urinary incontinence   . Thoracic outlet syndrome   . Rosacea   . Vitamin D deficiency disease    Past Surgical History  Procedure Laterality Date  . Mastectomy partial / lumpectomy w/ axillary lymphadenectomy  07/09/2002    Left - Dr Margot Chimes  . Port-a-cath removal  01/01/2004    Dr Margot Chimes   History   Social History  . Marital Status: Single    Spouse Name: N/A    Number of Children: N/A  . Years of Education: N/A   Occupational History  . Not on file.   Social History Main Topics  . Smoking status: Never Smoker   . Smokeless tobacco: Never Used  . Alcohol Use: 0.6 oz/week    1 Glasses of wine per week  . Drug Use: No  . Sexual Activity: No   Other Topics Concern  . Not on file   Social History Narrative  . No narrative on file   Family History  Problem Relation Age of Onset  . Hyperlipidemia Mother   . Heart disease Mother   . Diabetes Father   . Stroke Father   . Hypertension Father   . Cancer Paternal Aunt   . Stroke Maternal Grandmother   . Heart disease Maternal Grandmother   . Arthritis Maternal Grandmother   . Diabetes Maternal Grandmother    Patient Active Problem List   Diagnosis Date Noted  . Colon polyps 11/27/2012  . Rosacea 08/18/2012  . Menopause 08/16/2012  . Osteopenia 08/16/2012  . Urge incontinence 08/16/2012  . History of thoracic outlet syndrome  08/16/2012  . Morton's neuroma 08/16/2012  . History of vitamin D deficiency 08/16/2012  . Hx Breast cancer, IDC, Left, Stage II, 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   Current Outpatient Prescriptions on File Prior to Visit  Medication Sig Dispense Refill  . MULTIPLE VITAMIN PO Take 1 tablet by mouth.      . Sulfacetamide in Bakuchiol (SODIUM SULFACETAMIDE [place] EX) Apply topically.      . Sulfur 5 % LOTN Apply topically.       No current facility-administered medications on file prior to visit.      Review of Systems    See HPI Objective:   Physical Exam  Physical Exam  Nursing note and vitals reviewed.   Rapid flu positive Constitutional: She is oriented to person, place, and time. She appears well-developed and well-nourished. She is cooperative.  HENT:  Head: Normocephalic and atraumatic.  Nose: Mucosal edema present.  Eyes: Conjunctivae and EOM are normal. Pupils are equal, round, and reactive to light.  Neck: Neck supple.  Cardiovascular: Regular rhythm, normal heart sounds, intact distal pulses and normal pulses. Exam reveals no gallop and no friction rub.  No murmur heard.  Pulmonary/Chest: She has no  wheezes. She has rhonchi. She has no rales.  Neurological: She is alert and oriented to person, place, and time.  Skin: Skin is warm and dry. No abrasion, no bruising, no ecchymosis and no rash noted. No cyanosis. Nails show no clubbing.  Psychiatric: She has a normal mood and affect. Her speech is normal and behavior is normal.              Assessment & Plan:  Prolonged cough  CXR neg  She is flu positive but began 4-5 days ago.  She does not wish to take meds if she is getting better  I printed RX for Tamiflu  If any worsening  Bronchitis   Rx printed for Z-pak  She is to take only if worsening.   Cough:  Delsym  OTC    See me as needed

## 2014-02-19 NOTE — Patient Instructions (Signed)
See me if any worsening  

## 2014-03-04 ENCOUNTER — Emergency Department (HOSPITAL_BASED_OUTPATIENT_CLINIC_OR_DEPARTMENT_OTHER)
Admission: EM | Admit: 2014-03-04 | Discharge: 2014-03-04 | Disposition: A | Discharge status: Home / Self Care | Payer: BC Managed Care – PPO | Attending: Emergency Medicine | Admitting: Emergency Medicine

## 2014-03-04 ENCOUNTER — Encounter (HOSPITAL_BASED_OUTPATIENT_CLINIC_OR_DEPARTMENT_OTHER): Payer: Self-pay | Admitting: Emergency Medicine

## 2014-03-04 ENCOUNTER — Emergency Department (HOSPITAL_BASED_OUTPATIENT_CLINIC_OR_DEPARTMENT_OTHER): Payer: BC Managed Care – PPO

## 2014-03-04 DIAGNOSIS — Z872 Personal history of diseases of the skin and subcutaneous tissue: Secondary | ICD-10-CM | POA: Insufficient documentation

## 2014-03-04 DIAGNOSIS — Z862 Personal history of diseases of the blood and blood-forming organs and certain disorders involving the immune mechanism: Secondary | ICD-10-CM | POA: Insufficient documentation

## 2014-03-04 DIAGNOSIS — Z8639 Personal history of other endocrine, nutritional and metabolic disease: Secondary | ICD-10-CM | POA: Insufficient documentation

## 2014-03-04 DIAGNOSIS — Z8669 Personal history of other diseases of the nervous system and sense organs: Secondary | ICD-10-CM | POA: Insufficient documentation

## 2014-03-04 DIAGNOSIS — Z8739 Personal history of other diseases of the musculoskeletal system and connective tissue: Secondary | ICD-10-CM | POA: Insufficient documentation

## 2014-03-04 DIAGNOSIS — J329 Chronic sinusitis, unspecified: Secondary | ICD-10-CM | POA: Insufficient documentation

## 2014-03-04 DIAGNOSIS — Z79899 Other long term (current) drug therapy: Secondary | ICD-10-CM | POA: Insufficient documentation

## 2014-03-04 DIAGNOSIS — Z853 Personal history of malignant neoplasm of breast: Secondary | ICD-10-CM | POA: Insufficient documentation

## 2014-03-04 LAB — CBC WITH DIFFERENTIAL/PLATELET
BASOS ABS: 0 10*3/uL (ref 0.0–0.1)
Basophils Relative: 0 % (ref 0–1)
EOS PCT: 1 % (ref 0–5)
Eosinophils Absolute: 0 10*3/uL (ref 0.0–0.7)
HCT: 40.7 % (ref 36.0–46.0)
Hemoglobin: 14.1 g/dL (ref 12.0–15.0)
LYMPHS PCT: 17 % (ref 12–46)
Lymphs Abs: 1.5 10*3/uL (ref 0.7–4.0)
MCH: 30.7 pg (ref 26.0–34.0)
MCHC: 34.6 g/dL (ref 30.0–36.0)
MCV: 88.7 fL (ref 78.0–100.0)
Monocytes Absolute: 0.7 10*3/uL (ref 0.1–1.0)
Monocytes Relative: 8 % (ref 3–12)
NEUTROS ABS: 6.4 10*3/uL (ref 1.7–7.7)
Neutrophils Relative %: 74 % (ref 43–77)
PLATELETS: 184 10*3/uL (ref 150–400)
RBC: 4.59 MIL/uL (ref 3.87–5.11)
RDW: 12.5 % (ref 11.5–15.5)
WBC: 8.7 10*3/uL (ref 4.0–10.5)

## 2014-03-04 LAB — BASIC METABOLIC PANEL
BUN: 18 mg/dL (ref 6–23)
CALCIUM: 9.7 mg/dL (ref 8.4–10.5)
CHLORIDE: 98 meq/L (ref 96–112)
CO2: 25 meq/L (ref 19–32)
CREATININE: 0.8 mg/dL (ref 0.50–1.10)
GFR calc non Af Amer: 80 mL/min — ABNORMAL LOW (ref >90)
Glucose, Bld: 107 mg/dL — ABNORMAL HIGH (ref 70–99)
Potassium: 3.6 mEq/L — ABNORMAL LOW (ref 3.7–5.3)
SODIUM: 138 meq/L (ref 137–147)

## 2014-03-04 LAB — URINALYSIS, ROUTINE W REFLEX MICROSCOPIC
Bilirubin Urine: NEGATIVE
Glucose, UA: NEGATIVE mg/dL
KETONES UR: NEGATIVE mg/dL
LEUKOCYTES UA: NEGATIVE
NITRITE: NEGATIVE
PROTEIN: NEGATIVE mg/dL
Specific Gravity, Urine: 1.007 (ref 1.005–1.030)
Urobilinogen, UA: 0.2 mg/dL (ref 0.0–1.0)
pH: 6 (ref 5.0–8.0)

## 2014-03-04 LAB — URINE MICROSCOPIC-ADD ON

## 2014-03-04 MED ORDER — FLUTICASONE PROPIONATE 50 MCG/ACT NA SUSP
2.0000 | Freq: Every day | NASAL | Status: DC
Start: 2014-03-04 — End: 2015-02-25

## 2014-03-04 NOTE — ED Notes (Signed)
Woke with numbness on the left side of her face. She was seen normal at 1030pm before going to bed. She is alert. States she has been under extreme stress at work.

## 2014-03-04 NOTE — ED Provider Notes (Signed)
TIME SEEN: 7:27 PM  CHIEF COMPLAINT: numbness  HPI: HPI Comments: Allison Hernandez is a 58 y.o. female history of breast cancer who presents to the Emergency Department complaining of a "funny sensation" felt at her left cheek, onset this morning upon wakening. Patient reports that before going to bed at 10:30pm last night her face felt normal. Patient says she would not describe the sensation as numbness, but she states her left cheek feels "odd". Patient says the sensation has been intermittent throughout the day. Patient says she knows facial numbness is a sign of stroke which prompted her to visit the ED when it had yet to resolve. Patient now feels the sensation at her left cheek and tingling around her mouth and at the tip of her tongue.  Patient denies weakness or numbness of her extremities, changes in her hearing or vision, headache, chest pain, SOB fever, or pain in her teeth or gums, ear pain or discharge. Patient has no medical issues and denies history of hypertension, diabetes, hyperlipidemia, tobacco use, stroke or heart attack. Patient however shares family history of stroke in her father who had a stroke at 36.   ROS: See HPI Constitutional: no fever  Eyes: no drainage  ENT: no runny nose   Cardiovascular:  no chest pain  Resp: no SOB  GI: no vomiting GU: no dysuria Integumentary: no rash  Allergy: no hives  Musculoskeletal: no leg swelling  Neurological: no slurred speech ROS otherwise negative  PAST MEDICAL HISTORY/PAST SURGICAL HISTORY:  Past Medical History  Diagnosis Date  . 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 Osteopenia   . Urinary incontinence   . Thoracic outlet syndrome   . Rosacea   . Vitamin D deficiency disease     MEDICATIONS:  Prior to Admission medications   Medication Sig Start Date End Date Taking? Authorizing Provider  azithromycin (ZITHROMAX) 250 MG tablet Take as directed 02/19/14   Lanice Shirts, MD  MULTIPLE  VITAMIN PO Take 1 tablet by mouth.    Historical Provider, MD  oseltamivir (TAMIFLU) 75 MG capsule Take 1 capsule (75 mg total) by mouth 2 (two) times daily. 02/19/14   Lanice Shirts, MD  Sulfacetamide in Bakuchiol (SODIUM SULFACETAMIDE [facility] EX) Apply topically.    Historical Provider, MD  Sulfur 5 % LOTN Apply topically.    Historical Provider, MD    ALLERGIES:  Allergies  Allergen Reactions  . Chamomile Itching  . Benadryl [Diphenhydramine Hcl]     SOCIAL HISTORY:  History  Substance Use Topics  . Smoking status: Never Smoker   . Smokeless tobacco: Never Used  . Alcohol Use: 0.6 oz/week    1 Glasses of wine per week    FAMILY HISTORY: Family History  Problem Relation Age of Onset  . Hyperlipidemia Mother   . Heart disease Mother   . Diabetes Father   . Stroke Father   . Hypertension Father   . Cancer Paternal Aunt   . Stroke Maternal Grandmother   . Heart disease Maternal Grandmother   . Arthritis Maternal Grandmother   . Diabetes Maternal Grandmother     EXAM: BP 140/67  Pulse 94  Temp(Src) 98.9 F (37.2 C) (Oral)  Resp 16  Ht 5\' 8"  (1.727 m)  Wt 125 lb (56.7 kg)  BMI 19.01 kg/m2  SpO2 100% CONSTITUTIONAL: Alert and oriented and responds appropriately to questions. Well-appearing; well-nourished HEAD: Normocephalic EYES: Conjunctivae clear, PERRL ENT: normal nose; no rhinorrhea; moist mucous  membranes; pharynx without lesions noted, TMs are clear bilaterally, no tenderness over her TMJ or parotid NECK: Supple, no meningismus, no LAD  CARD: RRR; S1 and S2 appreciated; no murmurs, no clicks, no rubs, no gallops RESP: Normal chest excursion without splinting or tachypnea; breath sounds clear and equal bilaterally; no wheezes, no rhonchi, no rales,  ABD/GI: Normal bowel sounds; non-distended; soft, non-tender, no rebound, no guarding BACK:  The back appears normal and is non-tender to palpation, there is no CVA tenderness EXT: Normal ROM in all joints;  non-tender to palpation; no edema; normal capillary refill; no cyanosis    SKIN: Normal color for age and race; warm NEURO: Moves all extremities equally cranial nerves II through XII intact, sensation to light touch intact diffusely, strength 5/5 upper extremities, no dysmetria to finger-nose testing, normal gait PSYCH: The patient's mood and manner are appropriate. Grooming and personal hygiene are appropriate.  MEDICAL DECISION MAKING: Patient here with left-sided facial sensory changes that she does not describe as numbness. She has normal sensation to light touch diffusely. She is neurologically intact. Patient states she's concerned for stroke given her father had a history of stroke and 30 years old. This is unlikely a stroke the patient is very concerned and has a family history of early CVA therefore we'll obtain CT of her head, labs, urine and EKG.  ED PROGRESS: Patient's labs, urine, EKG are unremarkable. Her head CT shows bilateral acute sinusitis. No sign of acute intracranial hemorrhage, infarct, mass. 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 Patient reports that she is finishing a course of azithromycin currently. We'll discharge him with Flonase. Discussed with patient that this may be the cause of her "funny sensation" in her face. She still neurologically intact. Have discussed with patient that I do not feel this is stroke I feel she is safe to be discharged home and followup with her PCP. Patient verbalizes understanding and is comfortable with plan.    EKG Interpretation  Date/Time:  Tuesday March 04 2014 20:11:02 EDT Ventricular Rate:  86 PR Interval:  128 QRS Duration: 80 QT Interval:  378 QTC Calculation: 452 R Axis:   57 Text Interpretation:  Normal sinus rhythm Normal ECG Confirmed by Rivaldo Hineman,  DO, Mustafa Potts ([id number]) on 03/04/2014 8:34:18 PM        Glen Jean, DO 03/04/14 [id number]

## 2014-03-04 NOTE — Discharge Instructions (Signed)

## 2014-03-05 ENCOUNTER — Ambulatory Visit (INDEPENDENT_AMBULATORY_CARE_PROVIDER_SITE_OTHER): Payer: BC Managed Care – PPO | Admitting: Internal Medicine

## 2014-03-05 ENCOUNTER — Encounter: Payer: Self-pay | Admitting: Internal Medicine

## 2014-03-05 VITALS — BP 121/68 | HR 87 | Resp 16 | Ht 68.0 in | Wt 128.0 lb

## 2014-03-05 DIAGNOSIS — J3489 Other specified disorders of nose and nasal sinuses: Secondary | ICD-10-CM

## 2014-03-05 DIAGNOSIS — R0981 Nasal congestion: Secondary | ICD-10-CM

## 2014-03-05 DIAGNOSIS — J329 Chronic sinusitis, unspecified: Secondary | ICD-10-CM

## 2014-03-05 MED ORDER — CEFTRIAXONE SODIUM 1 G IJ SOLR
500.0000 mg | INTRAMUSCULAR | Status: AC
  Administered 2014-03-05: 500 mg via INTRAMUSCULAR

## 2014-03-05 NOTE — Progress Notes (Signed)
Subjective:    Patient ID: Allison Hernandez, female    DOB: 06/26/1956, 58 y.o.   MRN: [id number]  HPI  Allison Hernandez is her for ER follow up.  She had numbness in left side of face and was worried about a CVA.   Ct imaging suggests bilateral maxillary sinusits.  Osteoma also noted.  Pt reports she has had osteoma since age 81's.  She has had this worked up when she was much younger.  Numbness symptom is gone  She has nasal congestion and post nasal drip.  Was given Flonase in ER but is not using.   She did finish her Z-pak  No fever  Allergies  Allergen Reactions  . Chamomile Itching  . Benadryl [Diphenhydramine Hcl]    Past Medical History  Diagnosis Date  . 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 Osteopenia   . Urinary incontinence   . Thoracic outlet syndrome   . Rosacea   . Vitamin D deficiency disease    Past Surgical History  Procedure Laterality Date  . Mastectomy partial / lumpectomy w/ axillary lymphadenectomy  07/09/2002    Left - Dr Margot Chimes  . Port-a-cath removal  01/01/2004    Dr Margot Chimes   History   Social History  . Marital Status: Single    Spouse Name: N/A    Number of Children: N/A  . Years of Education: N/A   Occupational History  . Not on file.   Social History Main Topics  . Smoking status: Never Smoker   . Smokeless tobacco: Never Used  . Alcohol Use: 0.6 oz/week    1 Glasses of wine per week  . Drug Use: No  . Sexual Activity: No   Other Topics Concern  . Not on file   Social History Narrative  . No narrative on file   Family History  Problem Relation Age of Onset  . Hyperlipidemia Mother   . Heart disease Mother   . Diabetes Father   . Stroke Father   . Hypertension Father   . Cancer Paternal Aunt   . Stroke Maternal Grandmother   . Heart disease Maternal Grandmother   . Arthritis Maternal Grandmother   . Diabetes Maternal Grandmother    Patient Active Problem List   Diagnosis Date Noted  . Colon polyps 11/27/2012   . Rosacea 08/18/2012  . Menopause 08/16/2012  . Osteopenia 08/16/2012  . Urge incontinence 08/16/2012  . History of thoracic outlet syndrome 08/16/2012  . Morton's neuroma 08/16/2012  . History of vitamin D deficiency 08/16/2012  . Hx Breast cancer, IDC, Left, Stage II, 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   Current Outpatient Prescriptions on File Prior to Visit  Medication Sig Dispense Refill  . MULTIPLE VITAMIN PO Take 1 tablet by mouth.      . Sulfacetamide in Bakuchiol (SODIUM SULFACETAMIDE [place] EX) Apply topically.      . Sulfur 5 % LOTN Apply topically.      . fluticasone (FLONASE) 50 MCG/ACT nasal spray Place 2 sprays into both nostrils daily.  16 g  1   No current facility-administered medications on file prior to visit.      Review of Systems See HPI    Objective:   Physical Exam  Physical Exam  Nursing note and vitals reviewed.  Constitutional: She is oriented to person, place, and time. She appears well-developed and well-nourished.  HENT:  Head: Normocephalic and atraumatic.  Tm's  Bilateral serous effusion No pain over frontal sinus  NO pain over maxillary sinuses Cardiovascular: Normal rate and regular rhythm. Exam reveals no gallop and no friction rub.  No murmur heard.  Pulmonary/Chest: Breath sounds normal. She has no wheezes. She has no rales.  Neurological: She is alert and oriented to person, place, and time.  Skin: Skin is warm and dry.  Psychiatric: She has a normal mood and affect. Her behavior is normal.        Assessment & Plan:  Maxillary sinusitis /recent influenza   Will give Rocephin 500 mg in office.    Nasal congestion  Advised to use AFrin bid for 5 days.     Pt is going out of town and is advised to call me when she returns

## 2014-03-05 NOTE — Addendum Note (Signed)
Addended by: Gretchen Short on: 03/05/2014 04:27 PM   Modules accepted: Orders

## 2014-03-05 NOTE — Patient Instructions (Signed)
Use afrin nasal spray per bottle directions for 5 days  Call me at office when back from your trip

## 2014-03-11 ENCOUNTER — Ambulatory Visit (INDEPENDENT_AMBULATORY_CARE_PROVIDER_SITE_OTHER): Payer: BC Managed Care – PPO | Admitting: Internal Medicine

## 2014-03-11 ENCOUNTER — Encounter: Payer: Self-pay | Admitting: Internal Medicine

## 2014-03-11 VITALS — BP 113/61 | HR 100 | Temp 98.1°F | Resp 16 | Wt 126.0 lb

## 2014-03-11 DIAGNOSIS — J329 Chronic sinusitis, unspecified: Secondary | ICD-10-CM

## 2014-03-11 DIAGNOSIS — R110 Nausea: Secondary | ICD-10-CM

## 2014-03-11 DIAGNOSIS — R319 Hematuria, unspecified: Secondary | ICD-10-CM

## 2014-03-11 LAB — CBC WITH DIFFERENTIAL/PLATELET
BASOS ABS: 0 10*3/uL (ref 0.0–0.1)
BASOS PCT: 0 % (ref 0–1)
EOS ABS: 0 10*3/uL (ref 0.0–0.7)
EOS PCT: 0 % (ref 0–5)
HCT: 41.7 % (ref 36.0–46.0)
Hemoglobin: 14.1 g/dL (ref 12.0–15.0)
Lymphocytes Relative: 20 % (ref 12–46)
Lymphs Abs: 1.3 10*3/uL (ref 0.7–4.0)
MCH: 29.8 pg (ref 26.0–34.0)
MCHC: 33.8 g/dL (ref 30.0–36.0)
MCV: 88.2 fL (ref 78.0–100.0)
Monocytes Absolute: 0.4 10*3/uL (ref 0.1–1.0)
Monocytes Relative: 6 % (ref 3–12)
NEUTROS PCT: 74 % (ref 43–77)
Neutro Abs: 5 10*3/uL (ref 1.7–7.7)
PLATELETS: 207 10*3/uL (ref 150–400)
RBC: 4.73 MIL/uL (ref 3.87–5.11)
RDW: 13.2 % (ref 11.5–15.5)
WBC: 6.7 10*3/uL (ref 4.0–10.5)

## 2014-03-11 LAB — COMPREHENSIVE METABOLIC PANEL
ALK PHOS: 61 U/L (ref 39–117)
ALT: 20 U/L (ref 0–35)
AST: 27 U/L (ref 0–37)
Albumin: 4.4 g/dL (ref 3.5–5.2)
BILIRUBIN TOTAL: 1.2 mg/dL (ref 0.2–1.2)
BUN: 16 mg/dL (ref 6–23)
CO2: 28 mEq/L (ref 19–32)
Calcium: 10 mg/dL (ref 8.4–10.5)
Chloride: 100 mEq/L (ref 96–112)
Creat: 0.94 mg/dL (ref 0.50–1.10)
Glucose, Bld: 89 mg/dL (ref 70–99)
Potassium: 4.3 mEq/L (ref 3.5–5.3)
SODIUM: 135 meq/L (ref 135–145)
Total Protein: 7.5 g/dL (ref 6.0–8.3)

## 2014-03-11 LAB — POCT URINALYSIS DIPSTICK
BILIRUBIN UA: NEGATIVE
Glucose, UA: NEGATIVE
Ketones, UA: NEGATIVE
Leukocytes, UA: NEGATIVE
NITRITE UA: NEGATIVE
Protein, UA: NEGATIVE
Spec Grav, UA: 1.01
UROBILINOGEN UA: NEGATIVE
pH, UA: 7

## 2014-03-11 LAB — TSH: TSH: 1.034 u[IU]/mL (ref 0.350–4.500)

## 2014-03-11 LAB — AMYLASE: AMYLASE: 64 U/L (ref 0–105)

## 2014-03-11 MED ORDER — PROMETHAZINE HCL 25 MG PO TABS: 25.0000 mg | ORAL_TABLET | Freq: Three times a day (TID) | ORAL | Status: DC | PRN

## 2014-03-11 NOTE — Patient Instructions (Addendum)
Call me in am  For lab results.

## 2014-03-11 NOTE — Progress Notes (Signed)
Subjective:    Patient ID: Allison Hernandez, female    DOB: 03-Aug-1956, 58 y.o.   MRN: [id number]  HPI Allison Hernandez is here for follow up.    She tells me she feels clearer in her nose after using Afrin  But notes some swelling in Left side of face in am.    She began with Nausea last Saturday  - 3 days after her Rocephin injection  (500 mg given on 4/29)   for bilateral maxillary sinusitis .  She had a Z-pak prior to that.    No vomiting but lots of am retching that improves later in the day. No diarrhea  No abd pain no fever.    I note in ER eval that she had trace hgb pos on urine.  She denies CVA pain, no dysuria or urgency     Allergies  Allergen Reactions  . Chamomile Itching  . Benadryl [Diphenhydramine Hcl]    Past Medical History  Diagnosis Date  . 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 Osteopenia   . Urinary incontinence   . Thoracic outlet syndrome   . Rosacea   . Vitamin D deficiency disease    Past Surgical History  Procedure Laterality Date  . Mastectomy partial / lumpectomy w/ axillary lymphadenectomy  07/09/2002    Left - Dr Margot Chimes  . Port-a-cath removal  01/01/2004    Dr Margot Chimes   History   Social History  . Marital Status: Single    Spouse Name: N/A    Number of Children: N/A  . Years of Education: N/A   Occupational History  . Not on file.   Social History Main Topics  . Smoking status: Never Smoker   . Smokeless tobacco: Never Used  . Alcohol Use: 0.6 oz/week    1 Glasses of wine per week  . Drug Use: No  . Sexual Activity: No   Other Topics Concern  . Not on file   Social History Narrative  . No narrative on file   Family History  Problem Relation Age of Onset  . Hyperlipidemia Mother   . Heart disease Mother   . Diabetes Father   . Stroke Father   . Hypertension Father   . Cancer Paternal Aunt   . Stroke Maternal Grandmother   . Heart disease Maternal Grandmother   . Arthritis Maternal Grandmother   . Diabetes  Maternal Grandmother    Patient Active Problem List   Diagnosis Date Noted  . Colon polyps 11/27/2012  . Rosacea 08/18/2012  . Menopause 08/16/2012  . Osteopenia 08/16/2012  . Urge incontinence 08/16/2012  . History of thoracic outlet syndrome 08/16/2012  . Morton's neuroma 08/16/2012  . History of vitamin D deficiency 08/16/2012  . Hx Breast cancer, IDC, Left, Stage II, 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   Current Outpatient Prescriptions on File Prior to Visit  Medication Sig Dispense Refill  . fluticasone (FLONASE) 50 MCG/ACT nasal spray Place 2 sprays into both nostrils daily.  16 g  1  . MULTIPLE VITAMIN PO Take 1 tablet by mouth.      . Sulfacetamide in Bakuchiol (SODIUM SULFACETAMIDE [place] EX) Apply topically.      . Sulfur 5 % LOTN Apply topically.       Current Facility-Administered Medications on File Prior to Visit  Medication Dose Route Frequency Provider Last Rate Last Dose  . cefTRIAXone (ROCEPHIN) injection 500 mg  500 mg Intramuscular Q24H Lanice Shirts, MD   500  mg at 03/05/14 1624       Review of Systems See HPI    Objective:   Physical Exam Physical Exam  Nursing note and vitals reviewed.  Constitutional: She is oriented to person, place, and time. She appears well-developed and well-nourished.  HENT:  Head: Normocephalic and atraumatic.  Cardiovascular: Normal rate and regular rhythm. Exam reveals no gallop and no friction rub.  No murmur heard.  Pulmonary/Chest: Breath sounds normal. She has no wheezes. She has no rales.  Abd  Soft NT/ND  BS pos.  No HSM  No pain to palpation in any quadrant .  No rebound no peritoneal signs Neurological: She is alert and oriented to person, place, and time.  Skin: Skin is warm and dry.  Psychiatric: She has a normal mood and affect. Her behavior is normal.       Assessment & Plan:  Nausea etiology unclear.    Will check labs with amylase.  If persistant will need GI eval . Rx Phenergan 25 mg q6h prn nausea  Recent  maxillary sinusitis  I discussed pt needs follow up CT - she wishes to try to get nausea under control first.  Trace HGB pos on U/a  Repeat still pos.  Will send for cultlrue and microscopy    Pt is to call me in office in am for lab results

## 2014-03-12 ENCOUNTER — Encounter: Payer: Self-pay | Admitting: *Deleted

## 2014-03-12 LAB — URINE CULTURE
COLONY COUNT: NO GROWTH
ORGANISM ID, BACTERIA: NO GROWTH

## 2014-03-12 LAB — URINALYSIS, MICROSCOPIC ONLY
Bacteria, UA: NONE SEEN
Casts: NONE SEEN
Crystals: NONE SEEN
SQUAMOUS EPITHELIAL / LPF: NONE SEEN

## 2014-03-13 ENCOUNTER — Telehealth: Payer: Self-pay | Admitting: *Deleted

## 2014-03-13 NOTE — Telephone Encounter (Signed)
LVM message regarding urine cx results and advised pt to make an appt when she returns from out of town.

## 2014-03-13 NOTE — Telephone Encounter (Signed)
Message copied by Conley Rolls on Thu Mar 13, 2014  4:26 PM ------      Message from: Allison Hernandez      Created: Thu Mar 13, 2014 11:29 AM       Call pt and let her know there is not bacteria in her urine .  Have her see me in office when she returns form out of town  Give her 30 min appt ------

## 2014-07-17 ENCOUNTER — Other Ambulatory Visit: Payer: Self-pay

## 2014-07-17 DIAGNOSIS — Z1231 Encounter for screening mammogram for malignant neoplasm of breast: Secondary | ICD-10-CM

## 2014-08-05 ENCOUNTER — Ambulatory Visit
Admission: RE | Admit: 2014-08-05 | Discharge: 2014-08-05 | Disposition: A | Discharge status: Home / Self Care | Payer: BC Managed Care – PPO | Source: Ambulatory Visit

## 2014-08-05 DIAGNOSIS — Z1231 Encounter for screening mammogram for malignant neoplasm of breast: Secondary | ICD-10-CM

## 2015-01-27 ENCOUNTER — Ambulatory Visit: Payer: Self-pay | Admitting: Internal Medicine

## 2015-02-25 ENCOUNTER — Ambulatory Visit (INDEPENDENT_AMBULATORY_CARE_PROVIDER_SITE_OTHER): Payer: 59 | Admitting: Internal Medicine

## 2015-02-25 ENCOUNTER — Encounter: Payer: Self-pay | Admitting: Internal Medicine

## 2015-02-25 VITALS — BP 128/54 | HR 91 | Resp 16 | Ht 68.0 in | Wt 129.0 lb

## 2015-02-25 DIAGNOSIS — Z1151 Encounter for screening for human papillomavirus (HPV): Secondary | ICD-10-CM

## 2015-02-25 DIAGNOSIS — Z780 Asymptomatic menopausal state: Secondary | ICD-10-CM | POA: Diagnosis not present

## 2015-02-25 DIAGNOSIS — M8580 Other specified disorders of bone density and structure, unspecified site: Secondary | ICD-10-CM

## 2015-02-25 DIAGNOSIS — Z853 Personal history of malignant neoplasm of breast: Secondary | ICD-10-CM

## 2015-02-25 DIAGNOSIS — Z124 Encounter for screening for malignant neoplasm of cervix: Secondary | ICD-10-CM

## 2015-02-25 DIAGNOSIS — Z0000 Encounter for general adult medical examination without abnormal findings: Secondary | ICD-10-CM | POA: Diagnosis not present

## 2015-02-25 DIAGNOSIS — K635 Polyp of colon: Secondary | ICD-10-CM

## 2015-02-25 DIAGNOSIS — L719 Rosacea, unspecified: Secondary | ICD-10-CM

## 2015-02-25 LAB — POCT URINALYSIS DIPSTICK
BILIRUBIN UA: NEGATIVE
GLUCOSE UA: NEGATIVE
Ketones, UA: NEGATIVE
Leukocytes, UA: NEGATIVE
Nitrite, UA: NEGATIVE
Protein, UA: NEGATIVE
RBC UA: NEGATIVE
Spec Grav, UA: 1.02
UROBILINOGEN UA: NEGATIVE
pH, UA: 6

## 2015-02-25 NOTE — Progress Notes (Signed)
Subjective:    Patient ID: Allison Hernandez, female    DOB: 24-Jun-1956, 59 y.o.   MRN: [id number]  HPI  11/2012 CPE Health Maintenance See scanned HM sheet UTD with vaccines Zostavax info given. Had colonoscopy Dr. Penelope Coop 2011 per pt. She is on a 5 year schedule as she had polyps  Hyperlipidemia Framingham risk 3.5% Will recheck fasting lipids in am EKG today NSR no acute changes  Osteopenia She declines Dexa now  Urge incontinence She wishes no meds. Offered referral to PT and she will thing about it  Menopause  Stage II breast CA Her cousin is getting the BRCA test. If negative pt does not wish test, But she will call ffice if cousin tests positive  See me as needed  TODAY  Allison Hernandez is here for CPE  HM  Pap today  She is a non-smoker  Colonoscopy 2012  UTD with mm  Problem list reviewed  Allergies  Allergen Reactions  . Chamomile Itching  . Benadryl [Diphenhydramine Hcl]    Past Medical History  Diagnosis Date  . 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 Osteopenia   . Urinary incontinence   . Thoracic outlet syndrome   . Rosacea   . Vitamin D deficiency disease    Past Surgical History  Procedure Laterality Date  . Mastectomy partial / lumpectomy w/ axillary lymphadenectomy  07/09/2002    Left - Dr Margot Chimes  . Port-a-cath removal  01/01/2004    Dr Margot Chimes   History   Social History  . Marital Status: Single    Spouse Name: N/A  . Number of Children: N/A  . Years of Education: N/A   Occupational History  . Not on file.   Social History Main Topics  . Smoking status: Never Smoker   . Smokeless tobacco: Never Used  . Alcohol Use: 0.6 oz/week    1 Glasses of wine per week  . Drug Use: No  . Sexual Activity: No   Other Topics Concern  . Not on file   Social History Narrative  . No narrative on file   Family History  Problem Relation Age of Onset  . Hyperlipidemia Mother   . Heart disease Mother   . Diabetes  Father   . Stroke Father   . Hypertension Father   . Cancer Paternal Aunt   . Stroke Maternal Grandmother   . Heart disease Maternal Grandmother   . Arthritis Maternal Grandmother   . Diabetes Maternal Grandmother    Patient Active Problem List   Diagnosis Date Noted  . Colon polyps 11/27/2012  . Rosacea 08/18/2012  . Menopause 08/16/2012  . Osteopenia 08/16/2012  . Urge incontinence 08/16/2012  . History of thoracic outlet syndrome 08/16/2012  . Morton's neuroma 08/16/2012  . History of vitamin D deficiency 08/16/2012  . Hx Breast cancer, IDC, Left, Stage II, 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   Current Outpatient Prescriptions on File Prior to Visit  Medication Sig Dispense Refill  . fluticasone (FLONASE) 50 MCG/ACT nasal spray Place 2 sprays into both nostrils daily. 16 g 1  . MULTIPLE VITAMIN PO Take 1 tablet by mouth.    . promethazine (PHENERGAN) 25 MG tablet Take 1 tablet (25 mg total) by mouth every 8 (eight) hours as needed for nausea or vomiting. 20 tablet 0  . Sulfacetamide in Bakuchiol (SODIUM SULFACETAMIDE [place] EX) Apply topically.    . Sulfur 5 % LOTN Apply topically.     Current Facility-Administered Medications on  File Prior to Visit  Medication Dose Route Frequency Provider Last Rate Last Dose  . cefTRIAXone (ROCEPHIN) injection 500 mg  500 mg Intramuscular Q24H Lanice Shirts, MD   500 mg at 03/05/14 1624      Review of Systems  Respiratory: Negative for choking, chest tightness, shortness of breath and wheezing.   Cardiovascular: Negative for chest pain, palpitations and leg swelling.  All other systems reviewed and are negative.      Objective:   Physical Exam Physical Exam  Vital signs and nursing note reviewed  Constitutional: She is oriented to person, place, and time. She appears well-developed and well-nourished. She is cooperative.  HENT:  Head: Normocephalic and atraumatic.  Right Ear: Tympanic membrane normal.  Left Ear: Tympanic membrane normal.   Nose: Nose normal.  Mouth/Throat: Oropharynx is clear and moist and mucous membranes are normal. No oropharyngeal exudate or posterior oropharyngeal erythema.  Eyes: Conjunctivae and EOM are normal. Pupils are equal, round, and reactive to light.  Neck: Neck supple. No JVD present. Carotid bruit is not present. No mass and no thyromegaly present.  Cardiovascular: Regular rhythm, normal heart sounds, intact distal pulses and normal pulses.  Exam reveals no gallop and no friction rub.   No murmur heard. Pulses:      Dorsalis pedis pulses are 2+ on the right side, and 2+ on the left side.  Pulmonary/Chest: Breath sounds normal. She has no wheezes. She has no rhonchi. She has no rales. Right breast exhibits no mass, no nipple discharge and no skin change. Left breast exhibits no mass, no nipple discharge and no skin change.  Abdominal: Soft. Bowel sounds are normal. She exhibits no distension and no mass. There is no hepatosplenomegaly. There is no tenderness. There is no CVA tenderness.  Genitourinary: Rectum normal, vagina normal and uterus normal. Rectal exam shows no mass. Guaiac negative stool. No labial fusion. There is no lesion on the right labia. There is no lesion on the left labia. Cervix exhibits no motion tenderness. Right adnexum displays no mass, no tenderness and no fullness. Left adnexum displays no mass, no tenderness and no fullness. No erythema around the vagina.  Musculoskeletal:       No active synovitis to any joint.    Lymphadenopathy:       Right cervical: No superficial cervical adenopathy present.      Left cervical: No superficial cervical adenopathy present.       Right axillary: No pectoral and no lateral adenopathy present.       Left axillary: No pectoral and no lateral adenopathy present.      Right: No inguinal adenopathy present.       Left: No inguinal adenopathy present.  Neurological: She is alert and oriented to person, place, and time. She has normal strength  and normal reflexes. No cranial nerve deficit or sensory deficit. She displays a negative Romberg sign. Coordination and gait normal.  Skin: Skin is warm and dry. No abrasion, no bruising, no ecchymosis and no rash noted. No cyanosis. Nails show no clubbing.  Psychiatric: She has a normal mood and affect. Her speech is normal and behavior is normal.          Assessment & Plan:  HM  See scanned sheet  She is a non-smoker pap today   Osteopenia  Calcium / vitamin d  Stage II left breast CA  Allison Hernandez is a 80 year survivor and tells me she has been released form oncology .  Former  oncologic care Dr. Truddie Coco  Rosacea ok to refill sulfa lotion   Colon polps utd with colonoscopy advise repeat in 5 years  Pt received letter regarding my departure. She was advised to call this week for appt with new PCP of choice          Assessment & Plan:

## 2015-02-26 ENCOUNTER — Encounter: Payer: Self-pay | Admitting: *Deleted

## 2015-02-26 LAB — COMPLETE METABOLIC PANEL WITH GFR
ALT: 17 U/L (ref 0–35)
AST: 26 U/L (ref 0–37)
Albumin: 3.7 g/dL (ref 3.5–5.2)
Alkaline Phosphatase: 57 U/L (ref 39–117)
BILIRUBIN TOTAL: 1 mg/dL (ref 0.2–1.2)
BUN: 16 mg/dL (ref 6–23)
CO2: 26 meq/L (ref 19–32)
CREATININE: 0.94 mg/dL (ref 0.50–1.10)
Calcium: 8.8 mg/dL (ref 8.4–10.5)
Chloride: 99 mEq/L (ref 96–112)
GFR, EST NON AFRICAN AMERICAN: 67 mL/min
GFR, Est African American: 77 mL/min
Glucose, Bld: 78 mg/dL (ref 70–99)
Potassium: 3.8 mEq/L (ref 3.5–5.3)
Sodium: 135 mEq/L (ref 135–145)
Total Protein: 6.6 g/dL (ref 6.0–8.3)

## 2015-02-26 LAB — TSH: TSH: 1.28 u[IU]/mL (ref 0.350–4.500)

## 2015-02-26 LAB — LIPID PANEL
CHOLESTEROL: 190 mg/dL (ref 0–200)
HDL: 75 mg/dL (ref >=46)
LDL CALC: 101 mg/dL — AB (ref 0–99)
TRIGLYCERIDES: 70 mg/dL (ref <150)
Total CHOL/HDL Ratio: 2.5 Ratio
VLDL: 14 mg/dL (ref 0–40)

## 2015-02-26 LAB — CYTOLOGY - PAP

## 2015-02-27 LAB — CBC WITH DIFFERENTIAL/PLATELET
BASOS ABS: 0.1 10*3/uL (ref 0.0–0.1)
Basophils Relative: 1 % (ref 0–1)
Eosinophils Absolute: 0.1 10*3/uL (ref 0.0–0.7)
Eosinophils Relative: 2 % (ref 0–5)
HCT: 39.7 % (ref 36.0–46.0)
HEMOGLOBIN: 13.2 g/dL (ref 12.0–15.0)
LYMPHS PCT: 33 % (ref 12–46)
Lymphs Abs: 1.7 10*3/uL (ref 0.7–4.0)
MCH: 30 pg (ref 26.0–34.0)
MCHC: 33.2 g/dL (ref 30.0–36.0)
MCV: 90.2 fL (ref 78.0–100.0)
MPV: 9.9 fL (ref 8.6–12.4)
Monocytes Absolute: 0.4 10*3/uL (ref 0.1–1.0)
Monocytes Relative: 7 % (ref 3–12)
Neutro Abs: 2.9 10*3/uL (ref 1.7–7.7)
Neutrophils Relative %: 57 % (ref 43–77)
Platelets: 187 10*3/uL (ref 150–400)
RBC: 4.4 MIL/uL (ref 3.87–5.11)
RDW: 13.5 % (ref 11.5–15.5)
WBC: 5 10*3/uL (ref 4.0–10.5)

## 2015-02-27 LAB — VITAMIN D 25 HYDROXY (VIT D DEFICIENCY, FRACTURES): VIT D 25 HYDROXY: 37 ng/mL (ref 30–100)

## 2015-03-12 ENCOUNTER — Encounter: Payer: Self-pay | Admitting: Medical

## 2015-03-12 ENCOUNTER — Ambulatory Visit (INDEPENDENT_AMBULATORY_CARE_PROVIDER_SITE_OTHER): Payer: 59 | Admitting: Medical

## 2015-03-12 VITALS — BP 127/73 | HR 99 | Temp 98.9°F | Ht 68.0 in | Wt 128.2 lb

## 2015-03-12 DIAGNOSIS — J301 Allergic rhinitis due to pollen: Secondary | ICD-10-CM

## 2015-03-12 DIAGNOSIS — J309 Allergic rhinitis, unspecified: Secondary | ICD-10-CM | POA: Insufficient documentation

## 2015-03-12 MED ORDER — MOMETASONE FUROATE 50 MCG/ACT NA SUSP: NASAL | Status: DC

## 2015-03-12 MED ORDER — BENZONATATE 100 MG PO CAPS: 100.0000 mg | ORAL_CAPSULE | Freq: Three times a day (TID) | ORAL | Status: DC | PRN

## 2015-03-12 MED ORDER — AZITHROMYCIN 250 MG PO TABS: ORAL_TABLET | ORAL | Status: DC

## 2015-03-12 NOTE — Assessment & Plan Note (Signed)
Likely low level allergic rhinitis 1 wk duration with possibly early  secondary developing sinus infection vs bronchitis.  Rx nasonex nasal spray and benzonatate(for cough). If signs and symptoms worsen as discussed then start azithromycin.  Please stop afrin as rebound nasal congestion effect may take place and get worse.  Follow up in 7 days or as needed.

## 2015-03-12 NOTE — Progress Notes (Signed)
Subjective:    Patient ID: Allison Hernandez, female    DOB: 21-May-1956, 59 y.o.   MRN: [id number]  HPI  Pt in with a lot of nasal congestion for about 7 days. Some chest congestion as well. Some st. Saturday some moderate fatigue and low grade. Temp was 100 then. Last time fever 100 was on Tuesday.  No itching eyes. Occasional sneezing. No hx of allergies this time of the year.  Coughing a lot last night.(some productive cough in am) But no wheezing. Pt has been using afrin for 4 days.  No hx of smoking.   Review of Systems  Constitutional: Positive for fever. Negative for chills and fatigue.       Early on but not now.  HENT: Positive for congestion and postnasal drip. Negative for ear pain, mouth sores, nosebleeds, rhinorrhea, sinus pressure, sneezing, sore throat and tinnitus.   Respiratory: Positive for cough. Negative for choking, chest tightness, shortness of breath and wheezing.        Some chest congestion.  Cardiovascular: Negative for chest pain and palpitations.  Musculoskeletal: Negative for back pain.  Neurological: Negative for dizziness, seizures, syncope and headaches.  Hematological: Negative for adenopathy. Does not bruise/bleed easily.  Psychiatric/Behavioral: Negative for behavioral problems and confusion.   Past Medical History  Diagnosis Date  . 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 Osteopenia   . Urinary incontinence   . Thoracic outlet syndrome   . Rosacea   . Vitamin D deficiency disease   . Cancer     History   Social History  . Marital Status: Single    Spouse Name: N/A  . Number of Children: N/A  . Years of Education: N/A   Occupational History  . Not on file.   Social History Main Topics  . Smoking status: Never Smoker   . Smokeless tobacco: Never Used  . Alcohol Use: 0.6 oz/week    1 Glasses of wine per week  . Drug Use: No  . Sexual Activity: No   Other Topics Concern  . Not on file   Social History  Narrative    Past Surgical History  Procedure Laterality Date  . Mastectomy partial / lumpectomy w/ axillary lymphadenectomy  07/09/2002    Left - Dr Margot Chimes  . Port-a-cath removal  01/01/2004    Dr Margot Chimes    Family History  Problem Relation Age of Onset  . Hyperlipidemia Mother   . Heart disease Mother   . Diabetes Father   . Stroke Father   . Hypertension Father   . Cancer Paternal Aunt   . Stroke Maternal Grandmother   . Heart disease Maternal Grandmother   . Arthritis Maternal Grandmother   . Diabetes Maternal Grandmother     Allergies  Allergen Reactions  . Chamomile Itching  . Benadryl [Diphenhydramine Hcl]     Current Outpatient Prescriptions on File Prior to Visit  Medication Sig Dispense Refill  . Calcium Carbonate-Vit D-Min (CALCIUM 1200 PO) Take by mouth.    . cholecalciferol (VITAMIN D) 1000 UNITS tablet Take 1,000 Units by mouth daily.    . MULTIPLE VITAMIN PO Take 1 tablet by mouth.    . Sulfacetamide in Bakuchiol (SODIUM SULFACETAMIDE [place] EX) Apply topically.     Current Facility-Administered Medications on File Prior to Visit  Medication Dose Route Frequency Provider Last Rate Last Dose  . cefTRIAXone (ROCEPHIN) injection 500 mg  500 mg Intramuscular Q24H Lanice Shirts, MD  500 mg at 03/05/14 1624    BP 127/73 mmHg  Pulse 99  Temp(Src) 98.9 F (37.2 C) (Oral)  Ht 5\' 8"  (1.727 m)  Wt 128 lb 3.2 oz (58.151 kg)  BMI 19.50 kg/m2  SpO2 100%       Objective:   Physical Exam  General  Mental Status - Alert. General Appearance - Well groomed. Not in acute distress.  Skin Rashes- No Rashes.  HEENT Head- Normal. Ear Auditory Canal - Left- Normal. Right - Normal.Tympanic Membrane- Left- Normal. Right- Normal. Eye Sclera/Conjunctiva- Left- Normal. Right- Normal. Nose & Sinuses Nasal Mucosa- Left-  Boggy and Congested. Right-  Boggy and  Congested. No Bilateral maxillary or  frontal sinus pressure. Mouth & Throat Lips: Upper Lip- Normal:  no dryness, cracking, pallor, cyanosis, or vesicular eruption. Lower Lip-Normal: no dryness, cracking, pallor, cyanosis or vesicular eruption. Buccal Mucosa- Bilateral- No Aphthous ulcers. Oropharynx- No Discharge or Erythema. +pnd Tonsils: Characteristics- Bilateral- No Erythema or Congestion. Size/Enlargement- Bilateral- No enlargement. Discharge- bilateral-None.  Neck Neck- Supple. No Masses.   Chest and Lung Exam Auscultation: Breath Sounds:-Clear even and unlabored.  Cardiovascular Auscultation:Rythm- Regular, rate and rhythm. Murmurs & Other Heart Sounds:Ausculatation of the heart reveal- No Murmurs.  Lymphatic Head & Neck General Head & Neck Lymphatics: Bilateral: Description- No Localized lymphadenopathy.       Assessment & Plan:

## 2015-03-12 NOTE — Progress Notes (Signed)
Pre visit review using our clinic review tool, if applicable. No additional management support is needed unless otherwise documented below in the visit note. 

## 2015-03-12 NOTE — Patient Instructions (Signed)
Allergic rhinitis Likely low level allergic rhinitis 1 wk duration with possibly early  secondary developing sinus infection vs bronchitis.  Rx nasonex nasal spray and benzonatate(for cough). If signs and symptoms worsen as discussed then start azithromycin.  Please stop afrin as rebound nasal congestion effect may take place and get worse.  Follow up in 7 days or as needed.

## 2015-03-17 ENCOUNTER — Ambulatory Visit (INDEPENDENT_AMBULATORY_CARE_PROVIDER_SITE_OTHER): Payer: 59 | Admitting: Internal Medicine

## 2015-03-17 ENCOUNTER — Encounter: Payer: Self-pay | Admitting: Internal Medicine

## 2015-03-17 VITALS — BP 118/64 | HR 106 | Resp 16 | Wt 127.0 lb

## 2015-03-17 DIAGNOSIS — J329 Chronic sinusitis, unspecified: Secondary | ICD-10-CM

## 2015-03-17 DIAGNOSIS — D164 Benign neoplasm of bones of skull and face: Secondary | ICD-10-CM | POA: Insufficient documentation

## 2015-03-17 MED ORDER — CEFTRIAXONE SODIUM 1 G IJ SOLR: 1.0000 g | Freq: Once | INTRAMUSCULAR | Status: DC

## 2015-03-17 MED ORDER — CEFTRIAXONE SODIUM 1 G IJ SOLR
500.0000 mg | Freq: Once | INTRAMUSCULAR | Status: AC
  Administered 2015-03-17: 500 mg via INTRAMUSCULAR

## 2015-03-17 MED ORDER — SULFAMETHOXAZOLE-TRIMETHOPRIM 800-160 MG PO TABS: 1.0000 | ORAL_TABLET | Freq: Two times a day (BID) | ORAL | Status: DC

## 2015-03-17 NOTE — Progress Notes (Signed)
Subjective:    Patient ID: Allison Hernandez, female    DOB: 07-19-1956, 59 y.o.   MRN: [id number]  HPI 03/12/2015  PA note Allergic rhinitis - Allison Sprague Saguier, PA-C at 03/12/2015 12:14 PM     Status: Written Related Problem: Allergic rhinitis   Expand All Collapse All   Likely low level allergic rhinitis 1 wk duration with possibly early secondary developing sinus infection vs bronchitis.  Rx nasonex nasal spray and benzonatate(for cough). If signs and symptoms worsen as discussed then start azithromycin.  Please stop afrin as rebound nasal congestion effect may take place and get worse.  Follow up in 7 days or as needed.         O4/20/2016 my note Assessment & Plan:  HM See scanned sheet She is a non-smoker pap today   Osteopenia Calcium / vitamin d  Stage II left breast CA Allison Hernandez is a 50 year survivor and tells me she has been released form oncology . Former oncologic care Dr. Truddie Coco  Rosacea ok to refill sulfa lotion   Colon polps utd with colonoscopy advise repeat in 5 years  Pt received letter regarding my departure. She was advised to call this week for appt with new PCP of choice         TODAY  Allison Hernandez is here for follow up of visit for sinusitis.  See PA note of 5/5.    She has had several episodes of sinusitis in the past and does not like to take medication.  She did finish her Z-pak  But still has green nasal discharge No fever but now with cough of white mucous.  No chest pain no SOB .  She has stopped Afrin and has used a nasal steroid for a few days but it is not helping.   Had sinusitis  episode one year ago and I had advised her to have follow up sinus Ct but pt declined xray imaging as she felt better.    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 Chamomile Itching  . Benadryl [Diphenhydramine Hcl]    Past Medical History  Diagnosis Date  . Hx Breast cancer, IDC, Left, Stage II,  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  . Osteopenia   . Urinary incontinence   . Thoracic outlet syndrome   . Rosacea   . Vitamin D deficiency disease   . Cancer    Past Surgical History  Procedure Laterality Date  . Mastectomy partial / lumpectomy w/ axillary lymphadenectomy  07/09/2002    Left - Dr Margot Chimes  . Port-a-cath removal  01/01/2004    Dr Margot Chimes   History   Social History  . Marital Status: Single    Spouse Name: N/A  . Number of Children: N/A  . Years of Education: N/A   Occupational History  . Not on file.   Social History Main Topics  . Smoking status: Never Smoker   . Smokeless tobacco: Never Used  . Alcohol Use: 0.6 oz/week    1 Glasses of wine per week  . Drug Use: No  . Sexual Activity: No   Other Topics Concern  . Not on file   Social History Narrative   Family History  Problem Relation Age of Onset  . Hyperlipidemia Mother   . Heart disease Mother   . Diabetes Father   . Stroke Father   . Hypertension Father   . Cancer Paternal Aunt   .  Stroke Maternal Grandmother   . Heart disease Maternal Grandmother   . Arthritis Maternal Grandmother   . Diabetes Maternal Grandmother    Patient Active Problem List   Diagnosis Date Noted  . Allergic rhinitis 03/12/2015  . Colon polyps 11/27/2012  . Rosacea 08/18/2012  . Menopause 08/16/2012  . Osteopenia 08/16/2012  . Urge incontinence 08/16/2012  . History of thoracic outlet syndrome 08/16/2012  . Morton's neuroma 08/16/2012  . History of vitamin D deficiency 08/16/2012  . Hx Breast cancer, IDC, Left, Stage II, 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   Current Outpatient Prescriptions on File Prior to Visit  Medication Sig Dispense Refill  . azithromycin (ZITHROMAX) 250 MG tablet Take 2 tablets by mouth on day 1, followed by 1 tablet by mouth daily for 4 days. 6 tablet 0  . benzonatate (TESSALON) 100 MG capsule Take 1 capsule (100 mg total) by mouth 3 (three) times daily as needed for cough. 21 capsule 0  . Calcium Carbonate-Vit D-Min  (CALCIUM 1200 PO) Take by mouth.    . cholecalciferol (VITAMIN D) 1000 UNITS tablet Take 1,000 Units by mouth daily.    Marland Kitchen guaiFENesin (MUCINEX) 600 MG 12 hr tablet Take by mouth 2 (two) times daily.    . mometasone (NASONEX) 50 MCG/ACT nasal spray 2 sprays each nostril q day 17 g 12  . MULTIPLE VITAMIN PO Take 1 tablet by mouth.    . Oxymetazoline HCl (AFRIN NASAL SPRAY NA) Place into the nose.    . Sulfacetamide in Bakuchiol (SODIUM SULFACETAMIDE [place] EX) Apply topically.     Current Facility-Administered Medications on File Prior to Visit  Medication Dose Route Frequency Provider Last Rate Last Dose  . cefTRIAXone (ROCEPHIN) injection 500 mg  500 mg Intramuscular Q24H Lanice Shirts, MD   500 mg at 03/05/14 1624       Review of Systems See HPI    Objective:   Physical Exam  Physical Exam  Nursing note and vitals reviewed.  Constitutional: She is oriented to person, place, and time. She appears well-developed and well-nourished.  HENT:  Head: Normocephalic and atraumatic.  Cardiovascular: Normal rate and regular rhythm. Exam reveals no gallop and no friction rub.  No murmur heard.  Pulmonary/Chest: Breath sounds normal. She has no wheezes. She has no rales.  Neurological: She is alert and oriented to person, place, and time.  Skin: Skin is warm and dry.  Psychiatric: She has a normal mood and affect. Her behavior is normal.       Assessment & Plan:  Acute sinusitis superimposed on chronic symptoms.  Will give Rocephin 500 mg in office .  She is advised to finish Z-pak.   Pt would like to see ENT  And ok to refer.    Left parietal osteoma. Bone scan 2010 neg   Stage II breast CA she has been released from  Oncology care.    Pt has appt with new PCP

## 2015-03-31 ENCOUNTER — Encounter: Payer: Self-pay | Admitting: *Deleted

## 2015-07-28 ENCOUNTER — Other Ambulatory Visit: Payer: Self-pay

## 2015-07-28 DIAGNOSIS — Z853 Personal history of malignant neoplasm of breast: Secondary | ICD-10-CM

## 2015-07-28 DIAGNOSIS — Z1231 Encounter for screening mammogram for malignant neoplasm of breast: Secondary | ICD-10-CM

## 2015-07-28 DIAGNOSIS — Z9889 Other specified postprocedural states: Secondary | ICD-10-CM

## 2015-09-17 ENCOUNTER — Ambulatory Visit
Admission: RE | Admit: 2015-09-17 | Discharge: 2015-09-17 | Disposition: A | Discharge status: Home / Self Care | Payer: 59 | Source: Ambulatory Visit

## 2015-09-17 DIAGNOSIS — Z1231 Encounter for screening mammogram for malignant neoplasm of breast: Secondary | ICD-10-CM

## 2015-09-17 DIAGNOSIS — Z9889 Other specified postprocedural states: Secondary | ICD-10-CM

## 2015-09-17 DIAGNOSIS — Z853 Personal history of malignant neoplasm of breast: Secondary | ICD-10-CM

## 2016-04-12 ENCOUNTER — Other Ambulatory Visit: Payer: Self-pay | Admitting: Dermatology

## 2016-07-25 ENCOUNTER — Observation Stay (HOSPITAL_COMMUNITY)
Admission: EM | Admit: 2016-07-25 | Discharge: 2016-07-26 | Disposition: A | Discharge status: Home / Self Care | Payer: 59 | Attending: Surgery | Admitting: Surgery

## 2016-07-25 ENCOUNTER — Emergency Department (HOSPITAL_COMMUNITY): Payer: 59 | Admitting: Anesthesiology

## 2016-07-25 ENCOUNTER — Other Ambulatory Visit: Payer: Self-pay | Admitting: Internal Medicine

## 2016-07-25 ENCOUNTER — Ambulatory Visit
Admission: RE | Admit: 2016-07-25 | Discharge: 2016-07-25 | Disposition: A | Discharge status: Home / Self Care | Payer: 59 | Source: Ambulatory Visit | Attending: Internal Medicine | Admitting: Internal Medicine

## 2016-07-25 ENCOUNTER — Encounter (HOSPITAL_COMMUNITY)
Admission: EM | Disposition: A | Discharge status: Home / Self Care | Payer: Self-pay | Source: Home / Self Care | Attending: Emergency Medicine

## 2016-07-25 ENCOUNTER — Encounter (HOSPITAL_COMMUNITY): Payer: Self-pay | Admitting: *Deleted

## 2016-07-25 DIAGNOSIS — Z853 Personal history of malignant neoplasm of breast: Secondary | ICD-10-CM | POA: Diagnosis not present

## 2016-07-25 DIAGNOSIS — E559 Vitamin D deficiency, unspecified: Secondary | ICD-10-CM | POA: Insufficient documentation

## 2016-07-25 DIAGNOSIS — K353 Acute appendicitis with localized peritonitis, without perforation or gangrene: Secondary | ICD-10-CM

## 2016-07-25 DIAGNOSIS — R1030 Lower abdominal pain, unspecified: Secondary | ICD-10-CM

## 2016-07-25 DIAGNOSIS — R509 Fever, unspecified: Secondary | ICD-10-CM

## 2016-07-25 DIAGNOSIS — K3580 Unspecified acute appendicitis: Secondary | ICD-10-CM | POA: Diagnosis present

## 2016-07-25 HISTORY — PX: LAPAROSCOPIC APPENDECTOMY: SHX408

## 2016-07-25 LAB — CBC WITH DIFFERENTIAL/PLATELET
BASOS ABS: 0 10*3/uL (ref 0.0–0.1)
Basophils Relative: 0 %
Eosinophils Absolute: 0 10*3/uL (ref 0.0–0.7)
Eosinophils Relative: 0 %
HEMATOCRIT: 40 % (ref 36.0–46.0)
HEMOGLOBIN: 13.6 g/dL (ref 12.0–15.0)
LYMPHS PCT: 11 %
Lymphs Abs: 1.5 10*3/uL (ref 0.7–4.0)
MCH: 30.4 pg (ref 26.0–34.0)
MCHC: 34 g/dL (ref 30.0–36.0)
MCV: 89.3 fL (ref 78.0–100.0)
MONO ABS: 0.7 10*3/uL (ref 0.1–1.0)
MONOS PCT: 6 %
NEUTROS ABS: 11.1 10*3/uL — AB (ref 1.7–7.7)
Neutrophils Relative %: 83 %
Platelets: 139 10*3/uL — ABNORMAL LOW (ref 150–400)
RBC: 4.48 MIL/uL (ref 3.87–5.11)
RDW: 12.6 % (ref 11.5–15.5)
WBC: 13.3 10*3/uL — ABNORMAL HIGH (ref 4.0–10.5)

## 2016-07-25 LAB — COMPREHENSIVE METABOLIC PANEL
ALK PHOS: 63 U/L (ref 38–126)
ALT: 20 U/L (ref 14–54)
AST: 27 U/L (ref 15–41)
Albumin: 4 g/dL (ref 3.5–5.0)
Anion gap: 10 (ref 5–15)
BILIRUBIN TOTAL: 2.1 mg/dL — AB (ref 0.3–1.2)
BUN: 10 mg/dL (ref 6–20)
CALCIUM: 9.1 mg/dL (ref 8.9–10.3)
CO2: 25 mmol/L (ref 22–32)
Chloride: 100 mmol/L — ABNORMAL LOW (ref 101–111)
Creatinine, Ser: 0.97 mg/dL (ref 0.44–1.00)
GFR calc Af Amer: >60 mL/min (ref >60)
Glucose, Bld: 98 mg/dL (ref 65–99)
POTASSIUM: 3.1 mmol/L — AB (ref 3.5–5.1)
Sodium: 135 mmol/L (ref 135–145)
TOTAL PROTEIN: 7.5 g/dL (ref 6.5–8.1)

## 2016-07-25 LAB — I-STAT CG4 LACTIC ACID, ED: LACTIC ACID, VENOUS: 1.17 mmol/L (ref 0.5–1.9)

## 2016-07-25 SURGERY — APPENDECTOMY, LAPAROSCOPIC
Anesthesia: General | Site: Abdomen

## 2016-07-25 MED ORDER — IOPAMIDOL (ISOVUE-300) INJECTION 61%
100.0000 mL | Freq: Once | INTRAVENOUS | Status: AC | PRN
  Administered 2016-07-25: 100 mL via INTRAVENOUS

## 2016-07-25 MED ORDER — FENTANYL CITRATE (PF) 100 MCG/2ML IJ SOLN: 50.0000 ug | INTRAMUSCULAR | Status: DC | PRN

## 2016-07-25 MED ORDER — ONDANSETRON HCL 4 MG/2ML IJ SOLN: 4.0000 mg | Freq: Four times a day (QID) | INTRAMUSCULAR | Status: DC | PRN

## 2016-07-25 MED ORDER — SUGAMMADEX SODIUM 200 MG/2ML IV SOLN
INTRAVENOUS | Status: AC
  Filled 2016-07-25: qty 2

## 2016-07-25 MED ORDER — HYDROMORPHONE HCL 1 MG/ML IJ SOLN: 0.2500 mg | INTRAMUSCULAR | Status: DC | PRN

## 2016-07-25 MED ORDER — LIDOCAINE HCL (CARDIAC) 20 MG/ML IV SOLN
INTRAVENOUS | Status: DC | PRN
  Administered 2016-07-25: 60 mg via INTRAVENOUS

## 2016-07-25 MED ORDER — FENTANYL CITRATE (PF) 100 MCG/2ML IJ SOLN
INTRAMUSCULAR | Status: AC
  Filled 2016-07-25: qty 2

## 2016-07-25 MED ORDER — ENOXAPARIN SODIUM 40 MG/0.4ML ~~LOC~~ SOLN
40.0000 mg | SUBCUTANEOUS | Status: DC
  Administered 2016-07-26: 40 mg via SUBCUTANEOUS
  Filled 2016-07-25: qty 0.4

## 2016-07-25 MED ORDER — SUCCINYLCHOLINE CHLORIDE 20 MG/ML IJ SOLN
INTRAMUSCULAR | Status: DC | PRN
  Administered 2016-07-25: 120 mg via INTRAVENOUS

## 2016-07-25 MED ORDER — PROPOFOL 10 MG/ML IV BOLUS
INTRAVENOUS | Status: DC | PRN
  Administered 2016-07-25: 150 mg via INTRAVENOUS

## 2016-07-25 MED ORDER — POTASSIUM CHLORIDE IN NACL 20-0.9 MEQ/L-% IV SOLN
INTRAVENOUS | Status: DC
  Administered 2016-07-25: via INTRAVENOUS
  Filled 2016-07-25: qty 1000

## 2016-07-25 MED ORDER — PROPOFOL 10 MG/ML IV BOLUS
INTRAVENOUS | Status: AC
  Filled 2016-07-25: qty 20

## 2016-07-25 MED ORDER — MIDAZOLAM HCL 5 MG/5ML IJ SOLN
INTRAMUSCULAR | Status: DC | PRN
  Administered 2016-07-25 (×2): 1 mg via INTRAVENOUS

## 2016-07-25 MED ORDER — LACTATED RINGERS IV SOLN
INTRAVENOUS | Status: DC
  Administered 2016-07-25: 19:00:00 via INTRAVENOUS

## 2016-07-25 MED ORDER — OXYCODONE HCL 5 MG PO TABS
5.0000 mg | ORAL_TABLET | ORAL | Status: DC | PRN
  Administered 2016-07-26: 5 mg via ORAL
  Filled 2016-07-25: qty 2

## 2016-07-25 MED ORDER — LIDOCAINE 2% (20 MG/ML) 5 ML SYRINGE
INTRAMUSCULAR | Status: AC
  Filled 2016-07-25: qty 5

## 2016-07-25 MED ORDER — ARTIFICIAL TEARS OP OINT
TOPICAL_OINTMENT | OPHTHALMIC | Status: AC
  Filled 2016-07-25: qty 7

## 2016-07-25 MED ORDER — ONDANSETRON 4 MG PO TBDP: 4.0000 mg | ORAL_TABLET | Freq: Four times a day (QID) | ORAL | Status: DC | PRN

## 2016-07-25 MED ORDER — ACETAMINOPHEN 325 MG PO TABS: 650.0000 mg | ORAL_TABLET | Freq: Four times a day (QID) | ORAL | Status: DC | PRN

## 2016-07-25 MED ORDER — BUPIVACAINE-EPINEPHRINE (PF) 0.25% -1:200000 IJ SOLN
INTRAMUSCULAR | Status: AC
  Filled 2016-07-25: qty 30

## 2016-07-25 MED ORDER — 0.9 % SODIUM CHLORIDE (POUR BTL) OPTIME
TOPICAL | Status: DC | PRN
  Administered 2016-07-25: 1000 mL

## 2016-07-25 MED ORDER — SODIUM CHLORIDE 0.9 % IV BOLUS (SEPSIS)
1000.0000 mL | Freq: Once | INTRAVENOUS | Status: AC
  Administered 2016-07-25: 1000 mL via INTRAVENOUS

## 2016-07-25 MED ORDER — SUGAMMADEX SODIUM 200 MG/2ML IV SOLN
INTRAVENOUS | Status: DC | PRN
  Administered 2016-07-25: 150 mg via INTRAVENOUS

## 2016-07-25 MED ORDER — ACETAMINOPHEN 650 MG RE SUPP
650.0000 mg | Freq: Four times a day (QID) | RECTAL | Status: DC | PRN
Start: 2016-07-25 — End: 2016-07-26

## 2016-07-25 MED ORDER — SUCCINYLCHOLINE CHLORIDE 200 MG/10ML IV SOSY
PREFILLED_SYRINGE | INTRAVENOUS | Status: AC
  Filled 2016-07-25: qty 10

## 2016-07-25 MED ORDER — PIPERACILLIN-TAZOBACTAM 3.375 G IVPB 30 MIN
3.3750 g | Freq: Once | INTRAVENOUS | Status: AC
  Administered 2016-07-25: 3.375 g via INTRAVENOUS
  Filled 2016-07-25: qty 50

## 2016-07-25 MED ORDER — MORPHINE SULFATE (PF) 2 MG/ML IV SOLN: 1.0000 mg | INTRAVENOUS | Status: DC | PRN

## 2016-07-25 MED ORDER — BUPIVACAINE-EPINEPHRINE (PF) 0.25% -1:200000 IJ SOLN
INTRAMUSCULAR | Status: DC | PRN
  Administered 2016-07-25: 20 mL

## 2016-07-25 MED ORDER — MIDAZOLAM HCL 2 MG/2ML IJ SOLN
INTRAMUSCULAR | Status: AC
  Filled 2016-07-25: qty 2

## 2016-07-25 MED ORDER — LACTATED RINGERS IV SOLN
INTRAVENOUS | Status: DC | PRN
  Administered 2016-07-25 (×2): via INTRAVENOUS

## 2016-07-25 MED ORDER — ONDANSETRON HCL 4 MG/2ML IJ SOLN
INTRAMUSCULAR | Status: AC
  Filled 2016-07-25: qty 2

## 2016-07-25 MED ORDER — FENTANYL CITRATE (PF) 100 MCG/2ML IJ SOLN
INTRAMUSCULAR | Status: DC | PRN
  Administered 2016-07-25 (×2): 50 ug via INTRAVENOUS

## 2016-07-25 MED ORDER — ATROPINE SULFATE 1 MG/ML IJ SOLN
INTRAMUSCULAR | Status: AC
  Filled 2016-07-25: qty 1

## 2016-07-25 MED ORDER — SODIUM CHLORIDE 0.9 % IR SOLN
Status: DC | PRN
  Administered 2016-07-25: 1000 mL

## 2016-07-25 MED ORDER — ROCURONIUM BROMIDE 100 MG/10ML IV SOLN
INTRAVENOUS | Status: DC | PRN
  Administered 2016-07-25: 20 mg via INTRAVENOUS

## 2016-07-25 SURGICAL SUPPLY — 40 items
APPLIER CLIP 5 13 M/L LIGAMAX5 (MISCELLANEOUS)
APPLIER CLIP ROT 10 11.4 M/L (STAPLE)
APR CLP MED LRG 11.4X10 (STAPLE)
APR CLP MED LRG 5 ANG JAW (MISCELLANEOUS)
BAG SPEC RTRVL LRG 6X4 10 (ENDOMECHANICALS) ×1
CANISTER SUCTION 2500CC (MISCELLANEOUS) ×3 IMPLANT
CHLORAPREP W/TINT 26ML (MISCELLANEOUS) ×3 IMPLANT
CLIP APPLIE 5 13 M/L LIGAMAX5 (MISCELLANEOUS) IMPLANT
CLIP APPLIE ROT 10 11.4 M/L (STAPLE) IMPLANT
COVER SURGICAL LIGHT HANDLE (MISCELLANEOUS) ×3 IMPLANT
CUTTER FLEX LINEAR 45M (STAPLE) ×2 IMPLANT
ELECT REM PT RETURN 9FT ADLT (ELECTROSURGICAL) ×3
ELECTRODE REM PT RTRN 9FT ADLT (ELECTROSURGICAL) ×1 IMPLANT
GLOVE BIO SURGEON STRL SZ 6 (GLOVE) ×2 IMPLANT
GLOVE SURG SIGNA 7.5 PF LTX (GLOVE) ×3 IMPLANT
GOWN STRL REUS W/ TWL LRG LVL3 (GOWN DISPOSABLE) ×2 IMPLANT
GOWN STRL REUS W/ TWL XL LVL3 (GOWN DISPOSABLE) ×1 IMPLANT
GOWN STRL REUS W/TWL LRG LVL3 (GOWN DISPOSABLE) ×6
GOWN STRL REUS W/TWL XL LVL3 (GOWN DISPOSABLE) ×3
KIT BASIN OR (CUSTOM PROCEDURE TRAY) ×3 IMPLANT
KIT ROOM TURNOVER OR (KITS) ×3 IMPLANT
LIQUID BAND (GAUZE/BANDAGES/DRESSINGS) ×3 IMPLANT
NS IRRIG 1000ML POUR BTL (IV SOLUTION) ×3 IMPLANT
PAD ARMBOARD 7.5X6 YLW CONV (MISCELLANEOUS) ×6 IMPLANT
POUCH SPECIMEN RETRIEVAL 10MM (ENDOMECHANICALS) ×3 IMPLANT
RELOAD 45 VASCULAR/THIN (ENDOMECHANICALS) IMPLANT
RELOAD STAPLE 45 2.5 WHT GRN (ENDOMECHANICALS) IMPLANT
RELOAD STAPLE 45 3.5 BLU ETS (ENDOMECHANICALS) IMPLANT
RELOAD STAPLE TA45 3.5 REG BLU (ENDOMECHANICALS) ×3 IMPLANT
SCALPEL HARMONIC ACE (MISCELLANEOUS) ×3 IMPLANT
SET IRRIG TUBING LAPAROSCOPIC (IRRIGATION / IRRIGATOR) ×3 IMPLANT
SLEEVE ENDOPATH XCEL 5M (ENDOMECHANICALS) ×3 IMPLANT
SPECIMEN JAR SMALL (MISCELLANEOUS) ×3 IMPLANT
SUT MON AB 4-0 PC3 18 (SUTURE) ×3 IMPLANT
TOWEL OR 17X24 6PK STRL BLUE (TOWEL DISPOSABLE) ×3 IMPLANT
TOWEL OR 17X26 10 PK STRL BLUE (TOWEL DISPOSABLE) ×3 IMPLANT
TRAY LAPAROSCOPIC MC (CUSTOM PROCEDURE TRAY) ×3 IMPLANT
TROCAR XCEL BLUNT TIP 100MML (ENDOMECHANICALS) ×3 IMPLANT
TROCAR XCEL NON-BLD 5MMX100MML (ENDOMECHANICALS) ×3 IMPLANT
TUBING INSUFFLATION (TUBING) ×3 IMPLANT

## 2016-07-25 NOTE — ED Provider Notes (Signed)
[facility] DEPT Provider Note   CSN: [id number] Arrival date & time: 07/25/16  1609     History   Chief Complaint Chief Complaint  Patient presents with  . Abdominal Pain    HPI Allison Hernandez is a 60 y.o. female.  The history is provided by the patient and medical records.  Abdominal Pain   This is a new problem. The current episode started yesterday. The problem occurs constantly. The problem has been gradually worsening. The pain is associated with an unknown factor. The pain is located in the RLQ. The pain is moderate. Associated symptoms include fever (to 101F last night) and nausea. Pertinent negatives include vomiting. Nothing aggravates the symptoms. Nothing relieves the symptoms. Past workup includes CT scan.    Past Medical History:  Diagnosis Date  . Cancer ([place])   . 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 Osteopenia   . Rosacea   . Thoracic outlet syndrome   . Urinary incontinence   . Vitamin D deficiency disease     Patient Active Problem List   Diagnosis Date Noted  . 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 Allergic rhinitis 03/12/2015  . Colon polyps 11/27/2012  . Rosacea 08/18/2012  . Menopause 08/16/2012  . Osteopenia 08/16/2012  . Urge incontinence 08/16/2012  . History of thoracic outlet syndrome 08/16/2012  . Morton's neuroma 08/16/2012  . History of vitamin D deficiency 08/16/2012  . Hx Breast cancer, IDC, Left, Stage II, 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    Past Surgical History:  Procedure Laterality Date  . MASTECTOMY PARTIAL / LUMPECTOMY W/ AXILLARY LYMPHADENECTOMY  07/09/2002   Left - Dr Margot Chimes  . PORT-A-CATH REMOVAL  01/01/2004   Dr Margot Chimes    OB History    Gravida Para Term Preterm AB Living   0         0   SAB TAB Ectopic Multiple Live Births                   Home Medications    Prior to Admission medications   Medication Sig Start Date End Date Taking? Authorizing Provider  Calcium  Carbonate-Vit D-Min (CALCIUM 1200 PO) Take 1 tablet by mouth daily.    Yes Historical Provider, MD  cholecalciferol (VITAMIN D) 1000 UNITS tablet Take 1,000 Units by mouth daily.   Yes Historical Provider, MD  ibuprofen (ADVIL,MOTRIN) 200 MG tablet Take 200 mg by mouth every 6 (six) hours as needed (for pain).   Yes Historical Provider, MD  MULTIPLE VITAMIN PO Take 1 tablet by mouth daily.    Yes Historical Provider, MD  Sulfacetamide Sodium-Sulfur 10-5 % LOTN Apply 1 application topically daily.  02/26/15  Yes Historical Provider, MD  azithromycin (ZITHROMAX) 250 MG tablet Take 2 tablets by mouth on day 1, followed by 1 tablet by mouth daily for 4 days. Patient not taking: Reported on 07/25/2016 03/12/15   Mackie Pai, PA-C  benzonatate (TESSALON) 100 MG capsule Take 1 capsule (100 mg total) by mouth 3 (three) times daily as needed for cough. Patient not taking: Reported on 07/25/2016 03/12/15   Mackie Pai, PA-C  mometasone (NASONEX) 50 MCG/ACT nasal spray 2 sprays each nostril q day Patient not taking: Reported on 07/25/2016 03/12/15   Mackie Pai, PA-C  sulfamethoxazole-trimethoprim (BACTRIM DS,SEPTRA DS) 800-160 MG per tablet Take 1 tablet by mouth 2 (two) times daily. Patient not taking: Reported on 07/25/2016 03/17/15   Lanice Shirts, MD  Family History Family History  Problem Relation Age of Onset  . Hyperlipidemia Mother   . Heart disease Mother   . Diabetes Father   . Stroke Father   . Hypertension Father   . Cancer Paternal Aunt   . Stroke Maternal Grandmother   . Heart disease Maternal Grandmother   . Arthritis Maternal Grandmother   . Diabetes Maternal Grandmother     Social History Social History  Substance Use Topics  . Smoking status: Never Smoker  . Smokeless tobacco: Never Used  . Alcohol use 0.6 oz/week    1 Glasses of wine per week     Allergies   Chamomile and Benadryl [diphenhydramine hcl]   Review of Systems Review of Systems  Constitutional:  Positive for fever (to 101F last night).  Gastrointestinal: Positive for abdominal pain and nausea. Negative for vomiting.  All other systems reviewed and are negative.    Physical Exam Updated Vital Signs BP 138/70 (BP Location: Right Arm)   Pulse 90 Comment: Simultaneous filing. User may not have seen previous data.  Temp 99.1 F (37.3 C) (Oral)   Resp 16   Ht 5\' 7"  (1.702 m)   Wt 125 lb (56.7 kg)   SpO2 100% Comment: Simultaneous filing. User may not have seen previous data.  BMI 19.58 kg/m   Physical Exam  Constitutional: She is oriented to person, place, and time. She appears well-developed and well-nourished. No distress.  HENT:  Head: Normocephalic.  Eyes: Conjunctivae are normal.  Neck: Neck supple. No tracheal deviation present.  Cardiovascular: Normal rate and regular rhythm.   Pulmonary/Chest: Effort normal. No respiratory distress.  Abdominal: Soft. She exhibits no distension. There is tenderness in the right lower quadrant. There is rebound, guarding and tenderness at McBurney's point (with positive rovsing's). There is no rigidity.  Neurological: She is alert and oriented to person, place, and time.  Skin: Skin is warm and dry.  Psychiatric: She has a normal mood and affect.     ED Treatments / Results  Labs (all labs ordered are listed, but only abnormal results are displayed) Labs Reviewed  CBC WITH DIFFERENTIAL/PLATELET - Abnormal; Notable for the following:       Result Value   WBC 13.3 (*)    Platelets 139 (*)    Neutro Abs 11.1 (*)    All other components within normal limits  COMPREHENSIVE METABOLIC PANEL - Abnormal; Notable for the following:    Potassium 3.1 (*)    Chloride 100 (*)    Total Bilirubin 2.1 (*)    All other components within normal limits  CBC  I-STAT CG4 LACTIC ACID, ED  SURGICAL PATHOLOGY    EKG  EKG Interpretation None       Radiology Ct Abdomen Pelvis W Contrast  Result Date: 07/25/2016 CLINICAL DATA:  Epigastric  pain, lower abdominal pain for 2 days EXAM: CT ABDOMEN AND PELVIS WITH CONTRAST TECHNIQUE: Multidetector CT imaging of the abdomen and pelvis was performed using the standard protocol following bolus administration of intravenous contrast. CONTRAST:  160mL ISOVUE-300 IOPAMIDOL (ISOVUE-300) INJECTION 61% COMPARISON:  None. FINDINGS: Lower chest: No acute abnormality. Hepatobiliary: No focal liver abnormality is seen. No gallstones, gallbladder wall thickening, or biliary dilatation. Pancreas: Unremarkable. No pancreatic ductal dilatation or surrounding inflammatory changes. Spleen: Normal in size without focal abnormality. Adrenals/Urinary Tract: Adrenal glands are unremarkable. Kidneys are normal, without renal calculi, focal lesion, or hydronephrosis. Bladder is unremarkable. Stomach/Bowel: Stomach is within normal limits. Dilated appendix with mucosal thickening and periappendiceal  inflammatory changes most consistent with acute appendicitis. No evidence of bowel wall thickening, distention, or inflammatory changes. Vascular/Lymphatic: No significant vascular findings are present. No enlarged abdominal or pelvic lymph nodes. Reproductive: Uterus is normal. 3.5 cm hypodense, fluid attenuating right adnexal mass most consistent with an ovarian cyst. Other: No abdominal wall hernia or abnormality. No abdominopelvic ascites. Musculoskeletal: No acute or significant osseous findings. IMPRESSION: 1. Dilated appendix with mucosal thickening and periappendiceal inflammatory changes most consistent with acute appendicitis. Electronically Signed   By: Kathreen Devoid   On: 07/25/2016 15:13    Procedures Procedures (including critical care time)  Medications Ordered in ED Medications  piperacillin-tazobactam (ZOSYN) IVPB 3.375 g (not administered)  fentaNYL (SUBLIMAZE) injection 50 mcg (not administered)  sodium chloride 0.9 % bolus 1,000 mL (1,000 mLs Intravenous New Bag/Given 07/25/16 1721)     Initial Impression  / Assessment and Plan / ED Course  I have reviewed the triage vital signs and the nursing notes.  Pertinent labs & imaging results that were available during my care of the patient were reviewed by me and considered in my medical decision making (see chart for details).  Clinical Course    60 y.o. female presents with Right lower quadrant pain that started yesterday in the early morning hours. It progressed across her lower abdomen worse on the right, she went to see her primary care physician today who ordered a CT of her abdomen and pelvis which showed acute appendicitis. Outside labs demonstrated leukocytosis to 14,000, no other significant issues. Patient last ate at 10:30 AM today. Perioperative antibiotics were administered and repeat labs were drawn for preoperative purposes. Patient is NPO for surgery. No signs of generalized peritonitis or impending rupture currently. Gen. surgery was consulted and Dr. Ninfa Linden agreed to see the patient in the emergency department for evaluation.  Final Clinical Impressions(s) / ED Diagnoses   Final diagnoses:  Acute appendicitis with localized peritonitis    New Prescriptions New Prescriptions   No medications on file     Leo Grosser, MD 07/26/16 0107

## 2016-07-25 NOTE — Progress Notes (Signed)
Report given to [facility], CRNA

## 2016-07-25 NOTE — ED Notes (Addendum)
Pt sent here by her pcp for positive appendicitis on CT this afternoon.

## 2016-07-25 NOTE — Anesthesia Postprocedure Evaluation (Signed)
Anesthesia Post Note  Patient: Allison Hernandez  Procedure(s) Performed: Procedure(s) (LRB): APPENDECTOMY LAPAROSCOPIC (N/A)  Patient location during evaluation: PACU Anesthesia Type: General Level of consciousness: awake Pain management: pain level controlled Respiratory status: spontaneous breathing Cardiovascular status: stable Anesthetic complications: no    Last Vitals:  Vitals:   07/25/16 2109 07/25/16 2128  BP: 110/62   Pulse: 92 85  Resp: 16 13  Temp:      Last Pain:  Vitals:   07/25/16 2109  TempSrc:   PainSc: 2                  EDWARDS,Tykel Badie

## 2016-07-25 NOTE — Progress Notes (Signed)
Report to Suzzette Righter RN as primary caregiver.

## 2016-07-25 NOTE — H&P (Signed)
Allison Hernandez is an 60 y.o. female.   Chief Complaint: Right lower quadrant abdominal pain HPI: This is a 60 year old pleasant female who presents with right lower quadrant abdominal pain nausea, fever, and diarrhea. It started vaguely yesterday. The pain is now moderate in intensity and sharp in the right lower quadrant. She is otherwise without complaints. She has no previous similar history of abdominal pain  Past Medical History:  Diagnosis Date  . Cancer ([place])   . 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 Osteopenia   . Rosacea   . Thoracic outlet syndrome   . Urinary incontinence   . Vitamin D deficiency disease     Past Surgical History:  Procedure Laterality Date  . MASTECTOMY PARTIAL / LUMPECTOMY W/ AXILLARY LYMPHADENECTOMY  07/09/2002   Left - Dr Margot Chimes  . PORT-A-CATH REMOVAL  01/01/2004   Dr Margot Chimes    Family History  Problem Relation Age of Onset  . Hyperlipidemia Mother   . Heart disease Mother   . Diabetes Father   . Stroke Father   . Hypertension Father   . Cancer Paternal Aunt   . Stroke Maternal Grandmother   . Heart disease Maternal Grandmother   . Arthritis Maternal Grandmother   . Diabetes Maternal Grandmother    Social History:  reports that she has never smoked. She has never used smokeless tobacco. She reports that she drinks about 0.6 oz of alcohol per week . She reports that she does not use drugs.  Allergies:  Allergies  Allergen Reactions  . Chamomile Itching  . Benadryl [Diphenhydramine Hcl] Hives    Cannot tolerate in high doses     (Not in a hospital admission)  Results for orders placed or performed during the hospital encounter of 07/25/16 (from the past 48 hour(s))  I-Stat CG4 Lactic Acid, ED     Status: None   Collection Time: 07/25/16  5:35 PM  Result Value Ref Range   Lactic Acid, Venous 1.17 0.5 - 1.9 mmol/L  CBC with Differential/Platelet     Status: Abnormal   Collection Time: 07/25/16  5:36 PM  Result  Value Ref Range   WBC 13.3 (H) 4.0 - 10.5 K/uL   RBC 4.48 3.87 - 5.11 MIL/uL   Hemoglobin 13.6 12.0 - 15.0 g/dL   HCT 40.0 36.0 - 46.0 %   MCV 89.3 78.0 - 100.0 fL   MCH 30.4 26.0 - 34.0 pg   MCHC 34.0 30.0 - 36.0 g/dL   RDW 12.6 11.5 - 15.5 %   Platelets 139 (L) 150 - 400 K/uL   Neutrophils Relative % 83 %   Neutro Abs 11.1 (H) 1.7 - 7.7 K/uL   Lymphocytes Relative 11 %   Lymphs Abs 1.5 0.7 - 4.0 K/uL   Monocytes Relative 6 %   Monocytes Absolute 0.7 0.1 - 1.0 K/uL   Eosinophils Relative 0 %   Eosinophils Absolute 0.0 0.0 - 0.7 K/uL   Basophils Relative 0 %   Basophils Absolute 0.0 0.0 - 0.1 K/uL  Comprehensive metabolic panel     Status: Abnormal   Collection Time: 07/25/16  5:36 PM  Result Value Ref Range   Sodium 135 135 - 145 mmol/L   Potassium 3.1 (L) 3.5 - 5.1 mmol/L   Chloride 100 (L) 101 - 111 mmol/L   CO2 25 22 - 32 mmol/L   Glucose, Bld 98 65 - 99 mg/dL   BUN 10 6 - 20 mg/dL   Creatinine,  Ser 0.97 0.44 - 1.00 mg/dL   Calcium 9.1 8.9 - 10.3 mg/dL   Total Protein 7.5 6.5 - 8.1 g/dL   Albumin 4.0 3.5 - 5.0 g/dL   AST 27 15 - 41 U/L   ALT 20 14 - 54 U/L   Alkaline Phosphatase 63 38 - 126 U/L   Total Bilirubin 2.1 (H) 0.3 - 1.2 mg/dL   GFR calc non Af Amer >60 >60 mL/min   GFR calc Af Amer >60 >60 mL/min    Comment: (NOTE) The eGFR has been calculated using the CKD EPI equation. This calculation has not been validated in all clinical situations. eGFR's persistently <60 mL/min signify possible Chronic Kidney Disease.    Anion gap 10 5 - 15   Ct Abdomen Pelvis W Contrast  Result Date: 07/25/2016 CLINICAL DATA:  Epigastric pain, lower abdominal pain for 2 days EXAM: CT ABDOMEN AND PELVIS WITH CONTRAST TECHNIQUE: Multidetector CT imaging of the abdomen and pelvis was performed using the standard protocol following bolus administration of intravenous contrast. CONTRAST:  164m ISOVUE-300 IOPAMIDOL (ISOVUE-300) INJECTION 61% COMPARISON:  None. FINDINGS: Lower chest:  No acute abnormality. Hepatobiliary: No focal liver abnormality is seen. No gallstones, gallbladder wall thickening, or biliary dilatation. Pancreas: Unremarkable. No pancreatic ductal dilatation or surrounding inflammatory changes. Spleen: Normal in size without focal abnormality. Adrenals/Urinary Tract: Adrenal glands are unremarkable. Kidneys are normal, without renal calculi, focal lesion, or hydronephrosis. Bladder is unremarkable. Stomach/Bowel: Stomach is within normal limits. Dilated appendix with mucosal thickening and periappendiceal inflammatory changes most consistent with acute appendicitis. No evidence of bowel wall thickening, distention, or inflammatory changes. Vascular/Lymphatic: No significant vascular findings are present. No enlarged abdominal or pelvic lymph nodes. Reproductive: Uterus is normal. 3.5 cm hypodense, fluid attenuating right adnexal mass most consistent with an ovarian cyst. Other: No abdominal wall hernia or abnormality. No abdominopelvic ascites. Musculoskeletal: No acute or significant osseous findings. IMPRESSION: 1. Dilated appendix with mucosal thickening and periappendiceal inflammatory changes most consistent with acute appendicitis. Electronically Signed   By: HKathreen Devoid  On: 07/25/2016 15:13    Review of Systems  All other systems reviewed and are negative.   Blood pressure 138/70, pulse 90, temperature 99.1 F (37.3 C), temperature source Oral, resp. rate 16, height '5\' 7"'  (1.702 m), weight 56.7 kg (125 lb), SpO2 100 %. Physical Exam  Constitutional: She is oriented to person, place, and time. She appears well-developed and well-nourished. No distress.  HENT:  Head: Normocephalic and atraumatic.  Right Ear: External ear normal.  Left Ear: External ear normal.  Nose: Nose normal.  Mouth/Throat: Oropharynx is clear and moist. No oropharyngeal exudate.  Eyes: Conjunctivae are normal. Pupils are equal, round, and reactive to light. Right eye exhibits no  discharge. Left eye exhibits no discharge. No scleral icterus.  Neck: Normal range of motion. No tracheal deviation present.  Cardiovascular: Normal rate, regular rhythm, normal heart sounds and intact distal pulses.   No murmur heard. Respiratory: Effort normal and breath sounds normal. No respiratory distress. She has no wheezes.  GI: Soft. She exhibits no distension. There is tenderness. There is guarding.  There is mild tenderness with guarding in the right lower quadrant  Musculoskeletal: Normal range of motion. She exhibits no edema.  Lymphadenopathy:    She has no cervical adenopathy.  Neurological: She is alert and oriented to person, place, and time.  Skin: Skin is warm and dry. No rash noted. She is not diaphoretic. No erythema.  Psychiatric: Her behavior is normal.  Judgment normal.     Assessment/Plan Acute appendicitis  Removal of the appendix is recommended. IV anabiotic supported been given. I discussed laparoscopic appendectomy with her. I discussed the risks which includes but is not limited to bleeding, infection, injury to surrounding structures, need for further surgery, cardiopulmonary issues, postoperative recovery, etc. She understands and wishes to proceed. Surgery is scheduled.  Harl Bowie, MD 07/25/2016, 6:32 PM

## 2016-07-25 NOTE — Op Note (Signed)
Appendectomy, Lap, Procedure Note  Indications: The patient presented with a history of right-sided abdominal pain. A CT revealed findings consistent with acute appendicitis.  Pre-operative Diagnosis: acute appendicitis  Post-operative Diagnosis: Same  Surgeon: Coralie Keens A   Assistants: 0  Anesthesia: General endotracheal anesthesia  ASA Class: 2  Procedure Details  The patient was seen again in the Holding Room. The risks, benefits, complications, treatment options, and expected outcomes were discussed with the patient and/or family. The possibilities of reaction to medication, perforation of viscus, bleeding, recurrent infection, finding a normal appendix, the need for additional procedures, failure to diagnose a condition, and creating a complication requiring transfusion or operation were discussed. There was concurrence with the proposed plan and informed consent was obtained. The site of surgery was properly noted. The patient was taken to Operating Room, identified as Allison Hernandez and the procedure verified as Appendectomy. A Time Out was held and the above information confirmed.  The patient was placed in the supine position and general anesthesia was induced, along with placement of orogastric tube, Venodyne boots, and a Foley catheter. The abdomen was prepped and draped in a sterile fashion. A one centimeter infraumbilical incision was made.   the midline fascia was incised with a #16 blade.  A Kelly clamp was used to confirm entrance into the peritoneal cavity.  A pursestring suture was passed around the incision with a 0 Vicryl.  The Hasson was introduced into the abdomen and the tails of the suture were used to hold the Hasson in place.   The pneumoperitoneum was then established to steady pressure of 15 mmHg.  Additional 5 mm cannulas then placed in the left lower quadrant of the abdomen and the suprapubic region under direct visualization. A careful evaluation of the  entire abdomen was carried out. The patient was placed in Trendelenburg and left lateral decubitus position. The small intestines were retracted in the cephalad and left lateral direction away from the pelvis and right lower quadrant. The patient was found to have an enlarged and inflamed appendix that was extending into the pelvis. There was no evidence of perforation. There was minimal fibrinous exudate.  The appendix was carefully dissected. The appendix was was skeletonized with the harmonic scalpel.   The appendix was divided at its base using an endo-GIA stapler. Minimal appendiceal stump was left in place. There was no evidence of bleeding, leakage, or complication after division of the appendix. Irrigation was also performed and irrigate suctioned from the abdomen as well.  The umbilical port site was closed with the purse string suture. There was no residual palpable fascial defect.  The trocar site skin wounds were closed with 4-0 Monocryl.  Instrument, sponge, and needle counts were correct at the conclusion of the case.   Findings: The appendix was found to be inflamed. There were not signs of necrosis.  There was not perforation. There was not abscess formation.  Estimated Blood Loss:  Minimal         Drains:none         Complications:  None; patient tolerated the procedure well.         Disposition: PACU - hemodynamically stable.         Condition: stable

## 2016-07-25 NOTE — ED Notes (Signed)
Report given to Short stay RN

## 2016-07-25 NOTE — ED Triage Notes (Signed)
Pt sent here for acute appendicitis on CT results.

## 2016-07-25 NOTE — Anesthesia Procedure Notes (Signed)
Procedure Name: Intubation Date/Time: 07/25/2016 7:39 PM Performed by: Zorita Pang Pre-anesthesia Checklist: Patient identified, Emergency Drugs available, Suction available and Patient being monitored Patient Re-evaluated:Patient Re-evaluated prior to inductionOxygen Delivery Method: Circle System Utilized Preoxygenation: Pre-oxygenation with 100% oxygen Intubation Type: IV induction Ventilation: Mask ventilation without difficulty Laryngoscope Size: Mac and 3 Grade View: Grade I Tube type: Oral Tube size: 7.0 mm Number of attempts: 1 Airway Equipment and Method: Stylet and Oral airway Placement Confirmation: ETT inserted through vocal cords under direct vision,  positive ETCO2 and breath sounds checked- equal and bilateral Secured at: 22 cm Tube secured with: Tape Dental Injury: Teeth and Oropharynx as per pre-operative assessment

## 2016-07-25 NOTE — Anesthesia Preprocedure Evaluation (Addendum)
Anesthesia Evaluation  Patient identified by MRN, date of birth, ID band Patient awake    Reviewed: Allergy & Precautions, NPO status , Patient's Chart, lab work & pertinent test results  Airway Mallampati: I       Dental   Pulmonary neg pulmonary ROS,    breath sounds clear to auscultation       Cardiovascular negative cardio ROS   Rhythm:Regular Rate:Normal     Neuro/Psych    GI/Hepatic Neg liver ROS, History noted. CE   Endo/Other  negative endocrine ROS  Renal/GU negative Renal ROS     Musculoskeletal   Abdominal   Peds  Hematology   Anesthesia Other Findings   Reproductive/Obstetrics                            Anesthesia Physical Anesthesia Plan  ASA: I and emergent  Anesthesia Plan: General   Post-op Pain Management:    Induction: Intravenous, Rapid sequence and Cricoid pressure planned  Airway Management Planned: Oral ETT  Additional Equipment:   Intra-op Plan:   Post-operative Plan: Extubation in OR  Informed Consent: I have reviewed the patients History and Physical, chart, labs and discussed the procedure including the risks, benefits and alternatives for the proposed anesthesia with the patient or authorized representative who has indicated his/her understanding and acceptance.   Dental advisory given  Plan Discussed with: CRNA and Anesthesiologist  Anesthesia Plan Comments:         Anesthesia Quick Evaluation

## 2016-07-25 NOTE — Transfer of Care (Signed)
Immediate Anesthesia Transfer of Care Note  Patient: Allison Hernandez  Procedure(s) Performed: Procedure(s): APPENDECTOMY LAPAROSCOPIC (N/A)  Patient Location: PACU  Anesthesia Type:General  Level of Consciousness: awake, alert  and oriented  Airway & Oxygen Therapy: Patient Spontanous Breathing and Patient connected to nasal cannula oxygen  Post-op Assessment: Report given to RN and Post -op Vital signs reviewed and stable  Post vital signs: Reviewed and stable  Last Vitals:  Vitals:   07/25/16 1815 07/25/16 1830  BP: (!) 109/52 109/56  Pulse: 84 75  Resp:    Temp:      Last Pain:  Vitals:   07/25/16 1657  TempSrc:   PainSc: 3          Complications: No apparent anesthesia complications

## 2016-07-26 ENCOUNTER — Encounter (HOSPITAL_COMMUNITY): Payer: Self-pay | Admitting: Surgery

## 2016-07-26 LAB — CBC
HEMATOCRIT: 33.5 % — AB (ref 36.0–46.0)
Hemoglobin: 11.3 g/dL — ABNORMAL LOW (ref 12.0–15.0)
MCH: 29.9 pg (ref 26.0–34.0)
MCHC: 33.7 g/dL (ref 30.0–36.0)
MCV: 88.6 fL (ref 78.0–100.0)
PLATELETS: 129 10*3/uL — AB (ref 150–400)
RBC: 3.78 MIL/uL — ABNORMAL LOW (ref 3.87–5.11)
RDW: 12.7 % (ref 11.5–15.5)
WBC: 12.1 10*3/uL — AB (ref 4.0–10.5)

## 2016-07-26 MED ORDER — OXYCODONE HCL 5 MG PO TABS: 5.0000 mg | ORAL_TABLET | ORAL | 0 refills | Status: DC | PRN

## 2016-07-26 NOTE — Progress Notes (Signed)
Pt is ambulating in the hall. Abd lap sites dry and intact. Pt tolerated regular diet this morning. Had flatus. Denies pain. 1045 Discharge instructions given to pt, verbalized understanding. Discharged home accompanied by family.

## 2016-07-26 NOTE — Discharge Summary (Signed)
  Patient ID: Allison Hernandez [id number] 60 y.o. 10-05-1956  Admit date: 07/25/2016  Discharge date and time: 07/26/2016  Admitting Physician: Coralie Keens  Discharge Physician: Adin Hector  Admission Diagnoses: Acute appendicitis with localized peritonitis [K35.3]  Discharge Diagnoses: Same  Operations: Procedure(s): APPENDECTOMY LAPAROSCOPIC  Admission Condition: fair  Discharged Condition: good  Indication for Admission: This is a 60 year old Caucasian female, in good health.  Prior partial mastectomy for breast cancer and Port-A-Cath placement.  Works as an Optometrist.  Presented with right lower quadrant abdominal pain nausea fever and diarrhea.  This had been going on for 24 hours.  The pain was getting worse.  No prior history. examination revealed localized abdominal tenderness right lower quadrant.  CT scan was consistent with uncomplicated appendicitis.   Hospital Course: The patient was evaluated by Dr. Coralie Keens.  Started on IV fluids and IV antibiotics.  She was taken to the operating room and underwent laparoscopic appendectomy.  Appendix is found to be acutely inflamed but not gangrenous and not perforated.  She was observed  overnight and felt much better the  following morning.  On postop day 1 she was alert.  Ambulating independently.  Able to void without difficulty.  Tolerating some food without nausea.  Pain was well controlled.  Examination revealed abdomen to be soft and minimally tender.  Not distended.  All trocar sites looked good.  I felt that she had met discharge criteria and she wanted to go home.  Diet and activities and return to normal activities were discussed.  Prescription for OxyIR given.  No antibiotics indicated.  I asked her to call the office and set up an appointment see Dr. Ninfa Linden in 3 weeks.  Consults: None  Significant Diagnostic Studies: CT scan.  Lab work.  Surgical pathology which is pending.  Treatments: surgery:  Laparoscopic appendectomy  Disposition: Home  Patient Instructions:    Medication List    STOP taking these medications   sulfamethoxazole-trimethoprim 800-160 MG tablet Commonly known as:  BACTRIM DS,SEPTRA DS     TAKE these medications   azithromycin 250 MG tablet Commonly known as:  ZITHROMAX Take 2 tablets by mouth on day 1, followed by 1 tablet by mouth daily for 4 days.   benzonatate 100 MG capsule Commonly known as:  TESSALON Take 1 capsule (100 mg total) by mouth 3 (three) times daily as needed for cough.   CALCIUM 1200 PO Take 1 tablet by mouth daily.   cholecalciferol 1000 units tablet Commonly known as:  VITAMIN D Take 1,000 Units by mouth daily.   ibuprofen 200 MG tablet Commonly known as:  ADVIL,MOTRIN Take 200 mg by mouth every 6 (six) hours as needed (for pain).   mometasone 50 MCG/ACT nasal spray Commonly known as:  NASONEX 2 sprays each nostril q day   MULTIPLE VITAMIN PO Take 1 tablet by mouth daily.   oxyCODONE 5 MG immediate release tablet Commonly known as:  Oxy IR/ROXICODONE Take 1-2 tablets (5-10 mg total) by mouth every 4 (four) hours as needed for moderate pain.   Sulfacetamide Sodium-Sulfur 10-5 % Lotn Apply 1 application topically daily.       Activity: Activity as discussed.  No sports or heavy lifting for 3 weeks. Diet: low fat, low cholesterol diet Wound Care: none needed  Follow-up:  With Dr. Coralie Keens in 3 weeks.  Signed: Edsel Petrin. Dalbert Batman, M.D., FACS General and minimally invasive surgery Breast and Colorectal Surgery  07/26/2016, 7:41 AM

## 2016-07-26 NOTE — Discharge Instructions (Signed)
[facility] ______[facility] °LAPAROSCOPIC SURGERY: POST OP INSTRUCTIONS °Always review your discharge instruction sheet given to you by the facility where your surgery was performed. °IF YOU HAVE DISABILITY OR FAMILY LEAVE FORMS, YOU MUST BRING THEM TO THE OFFICE FOR PROCESSING.   °DO NOT GIVE THEM TO YOUR DOCTOR. ° °1. A prescription for pain medication may be given to you upon discharge.  Take your pain medication as prescribed, if needed.  If narcotic pain medicine is not needed, then you may take acetaminophen (Tylenol) or ibuprofen (Advil) as needed. °2. Take your usually prescribed medications unless otherwise directed. °3. If you need a refill on your pain medication, please contact your pharmacy.  They will contact our office to request authorization. Prescriptions will not be filled after 5pm or on week-ends. °4. You should follow a light diet the first few days after arrival home, such as soup and crackers, etc.  Be sure to include lots of fluids daily. °5. Most patients will experience some swelling and bruising in the area of the incisions.  Ice packs will help.  Swelling and bruising can take several days to resolve.  °6. It is common to experience some constipation if taking pain medication after surgery.  Increasing fluid intake and taking a stool softener (such as Colace) will usually help or prevent this problem from occurring.  A mild laxative (Milk of Magnesia or Miralax) should be taken according to package instructions if there are no bowel movements after 48 hours. °7. Unless discharge instructions indicate otherwise, you may remove your bandages 24-48 hours after surgery, and you may shower at that time.  You may have steri-strips (small skin tapes) in place directly over the incision.  These strips should be left on the skin for 7-10 days.  If your surgeon used skin glue on the incision, you may shower in 24 hours.  The glue will flake off over the next 2-3 weeks.  Any sutures or  staples will be removed at the office during your follow-up visit. °8. ACTIVITIES:  You may resume regular (light) daily activities beginning the next day--such as daily self-care, walking, climbing stairs--gradually increasing activities as tolerated.  You may have sexual intercourse when it is comfortable.  Refrain from any heavy lifting or straining until approved by your doctor. °a. You may drive when you are no longer taking prescription pain medication, you can comfortably wear a seatbelt, and you can safely maneuver your car and apply brakes. °b. RETURN TO WORK:  __________________________________________________________ °9. You should see your doctor in the office for a follow-up appointment approximately 2-3 weeks after your surgery.  Make sure that you call for this appointment within a day or two after you arrive home to insure a convenient appointment time. °10. OTHER INSTRUCTIONS: __________________________________________________________________________________________________________________________ __________________________________________________________________________________________________________________________ °WHEN TO CALL YOUR DOCTOR: °1. Fever over 101.0 °2. Inability to urinate °3. Continued bleeding from incision. °4. Increased pain, redness, or drainage from the incision. °5. Increasing abdominal pain ° °The clinic staff is available to answer your questions during regular business hours.  Please don’t hesitate to call and ask to speak to one of the nurses for clinical concerns.  If you have a medical emergency, go to the nearest emergency room or call 911.  A surgeon from [facility] is always on call at the hospital. °[address], [address], [place], [place]  [address] ? [address] [address], [place], [place]   [address] °[phone] ? [phone] ? FAX [phone] °Web site:   [email or web address] °

## 2016-08-26 ENCOUNTER — Other Ambulatory Visit: Payer: Self-pay | Admitting: Internal Medicine

## 2016-08-26 DIAGNOSIS — Z1231 Encounter for screening mammogram for malignant neoplasm of breast: Secondary | ICD-10-CM

## 2016-09-27 ENCOUNTER — Ambulatory Visit
Admission: RE | Admit: 2016-09-27 | Discharge: 2016-09-27 | Disposition: A | Discharge status: Home / Self Care | Payer: 59 | Source: Ambulatory Visit | Attending: Internal Medicine | Admitting: Internal Medicine

## 2016-09-27 DIAGNOSIS — Z1231 Encounter for screening mammogram for malignant neoplasm of breast: Secondary | ICD-10-CM

## 2017-05-31 ENCOUNTER — Other Ambulatory Visit: Payer: Self-pay | Admitting: Internal Medicine

## 2017-05-31 DIAGNOSIS — R140 Abdominal distension (gaseous): Secondary | ICD-10-CM

## 2017-06-05 ENCOUNTER — Ambulatory Visit
Admission: RE | Admit: 2017-06-05 | Discharge: 2017-06-05 | Disposition: A | Discharge status: Home / Self Care | Payer: 59 | Source: Ambulatory Visit | Attending: Internal Medicine | Admitting: Internal Medicine

## 2017-06-05 DIAGNOSIS — R140 Abdominal distension (gaseous): Secondary | ICD-10-CM

## 2017-09-07 ENCOUNTER — Other Ambulatory Visit: Payer: Self-pay | Admitting: Internal Medicine

## 2017-09-07 DIAGNOSIS — Z1231 Encounter for screening mammogram for malignant neoplasm of breast: Secondary | ICD-10-CM

## 2017-10-10 ENCOUNTER — Ambulatory Visit
Admission: RE | Admit: 2017-10-10 | Discharge: 2017-10-10 | Disposition: A | Discharge status: Home / Self Care | Payer: 59 | Source: Ambulatory Visit | Attending: Internal Medicine | Admitting: Internal Medicine

## 2017-10-10 DIAGNOSIS — Z1231 Encounter for screening mammogram for malignant neoplasm of breast: Secondary | ICD-10-CM

## 2017-10-10 HISTORY — DX: Personal history of irradiation: Z92.3

## 2017-10-14 IMAGING — CT CT ABD-PELV W/ CM
3 of 5 series · 13 of 36 positions shown, 19 images · IV contrast (WATER & [ID] ISOVUE 300)
Comparison: None.

CLINICAL DATA: Epigastric pain, lower abdominal pain for 2 days

EXAM:
CT ABDOMEN AND PELVIS WITH CONTRAST
TECHNIQUE: Multidetector CT imaging of the abdomen and pelvis was performed
using the standard protocol following bolus administration of
intravenous contrast.
CONTRAST:  100mL [id number] IOPAMIDOL ([id number]) INJECTION 61%

[Series 3: abd/pelvis with · axial · 0.76mm/px · z∈[-353,-68]mm · 7 of 77 slices shown, 12 images]
[im 10/77  soft-tissue]
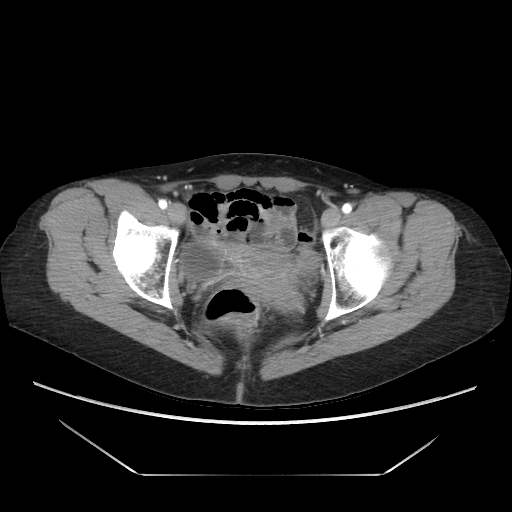
[im 10/77  bone]
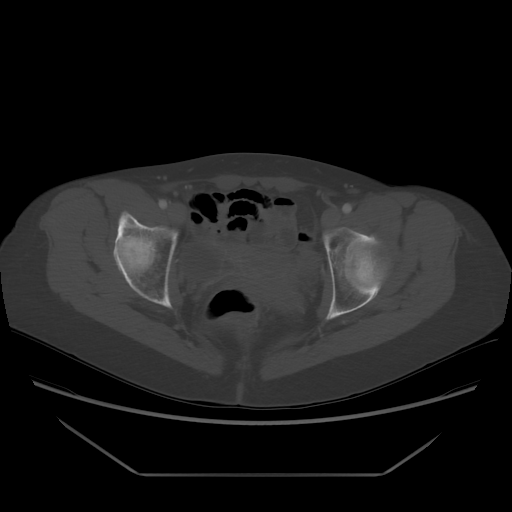
[im 20/77  soft-tissue]
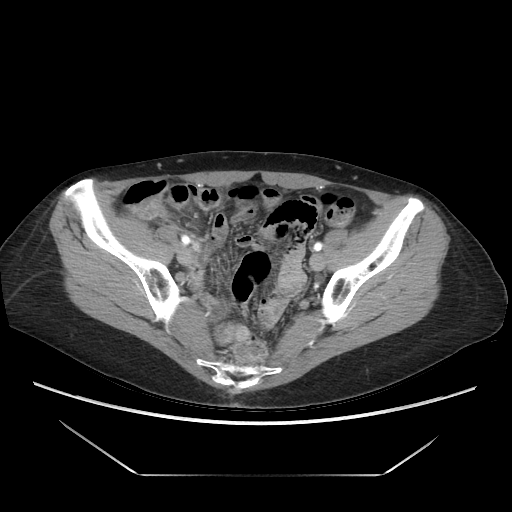
[im 29/77  soft-tissue]
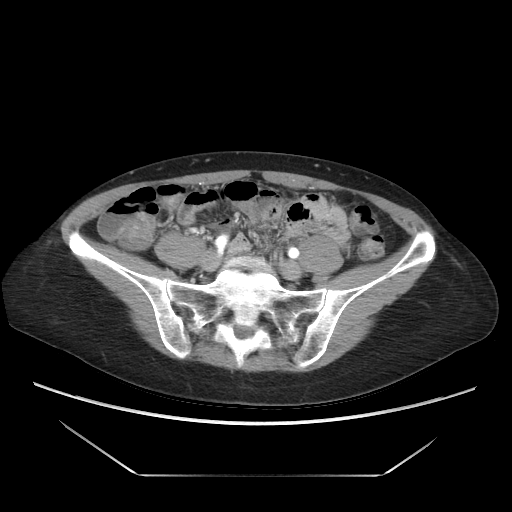
[im 39/77  soft-tissue]
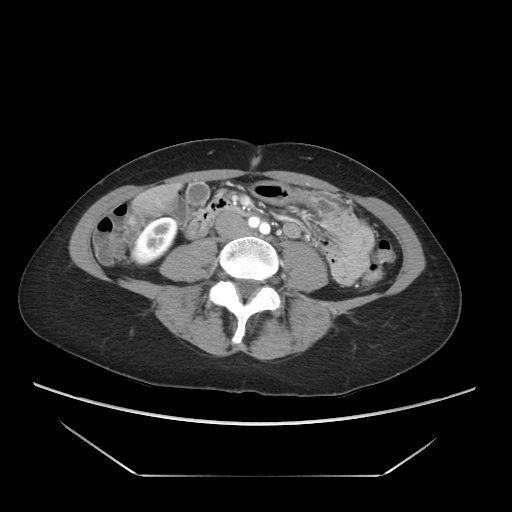
[im 39/77  lung]
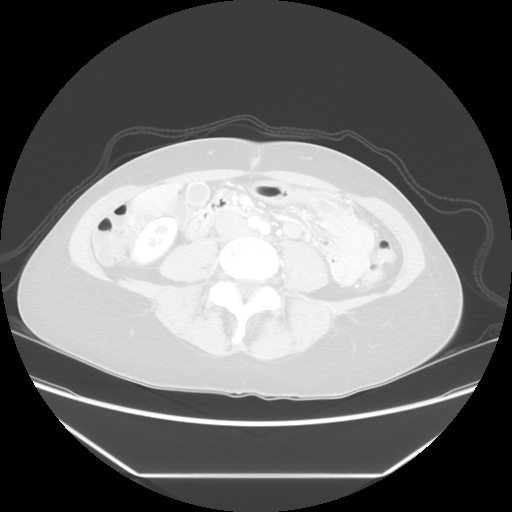
[im 48/77  soft-tissue]
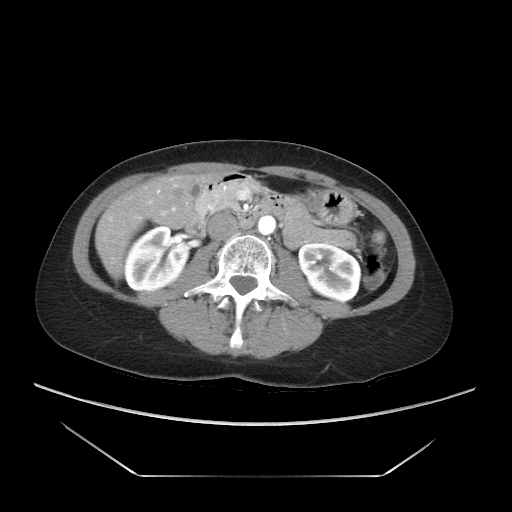
[im 48/77  lung]
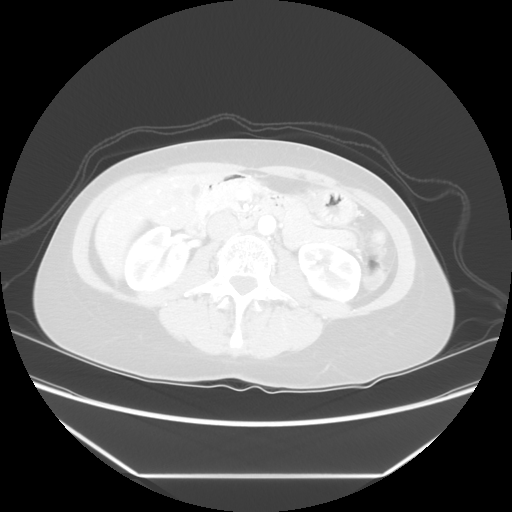
[im 58/77  soft-tissue]
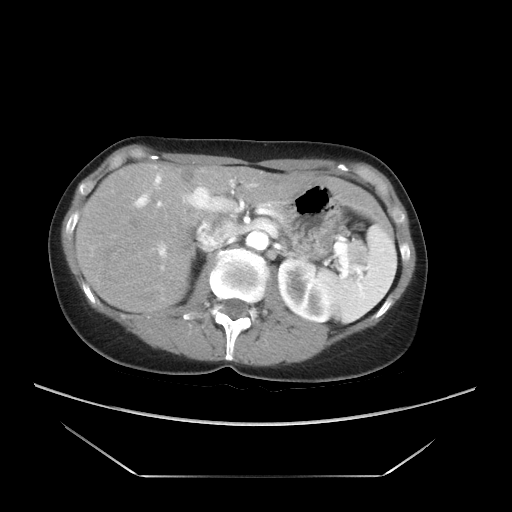
[im 58/77  lung]
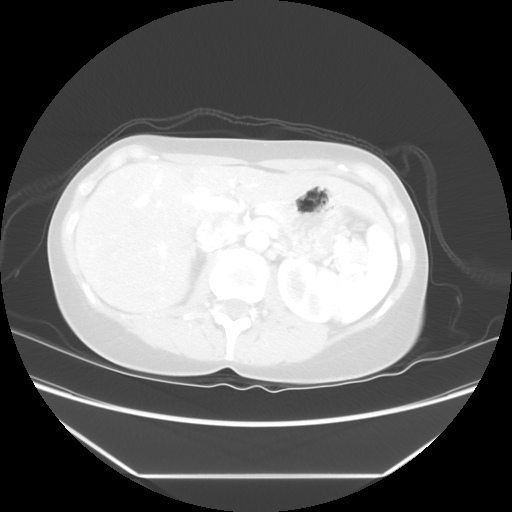
[im 67/77  soft-tissue]
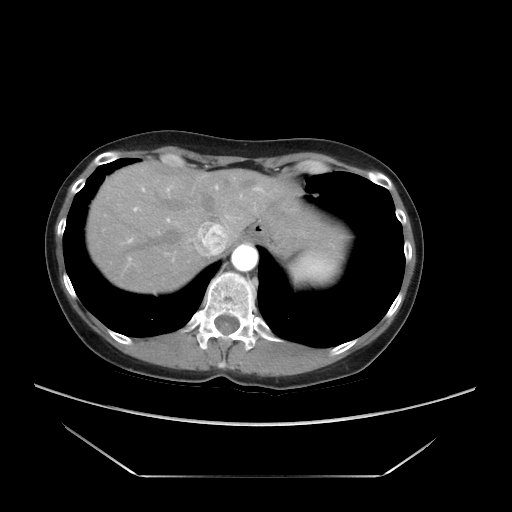
[im 67/77  lung]
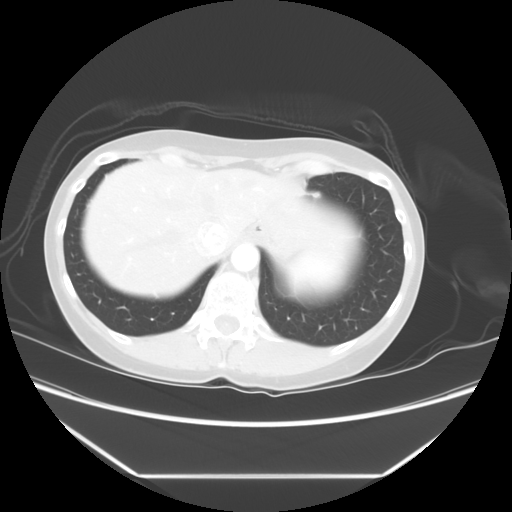

[Series 601: coronal body · coronal · 0.77mm/px · 1 of 93 slices shown, 2 images]
[im 31/93  soft-tissue]
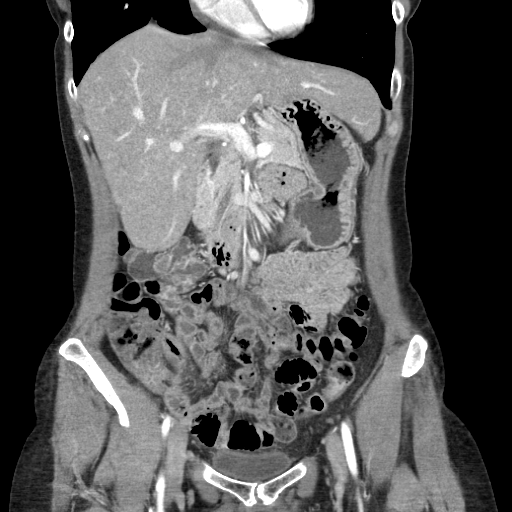
[im 31/93  bone]
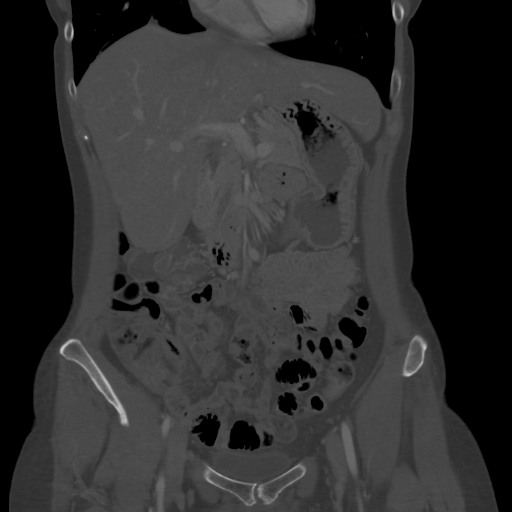

[Series 602: sagittal body · sagittal · 0.77mm/px · 5 of 158 slices shown]
[im 10/158  soft-tissue]
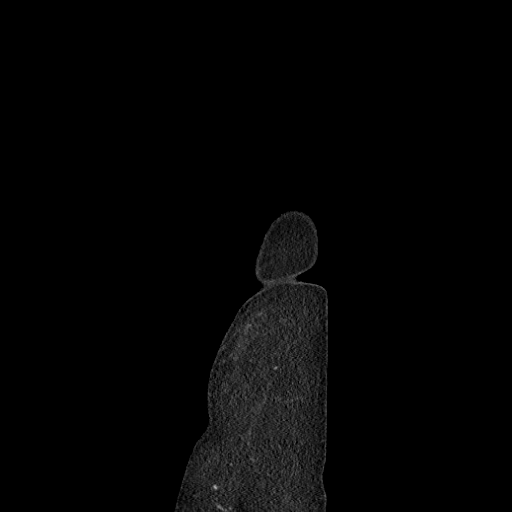
[im 30/158  soft-tissue]
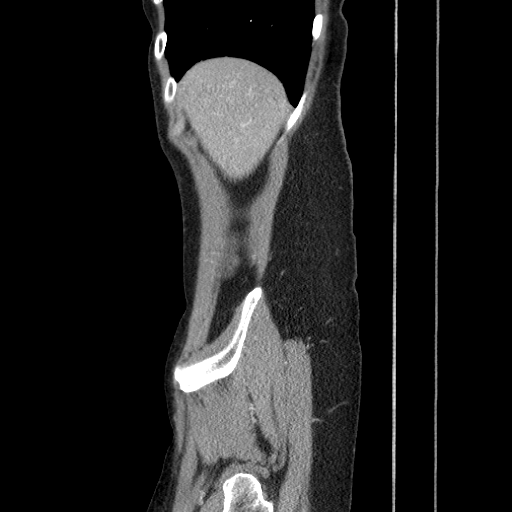
[im 50/158  soft-tissue]
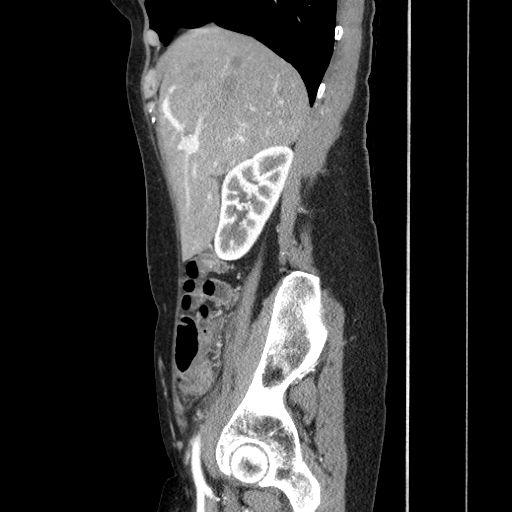
[im 69/158  soft-tissue]
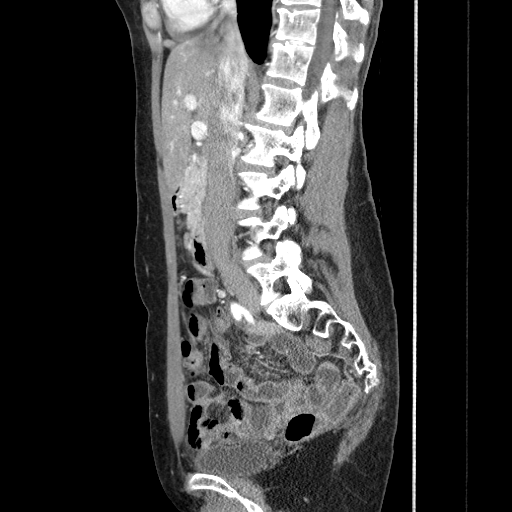
[im 89/158  soft-tissue]
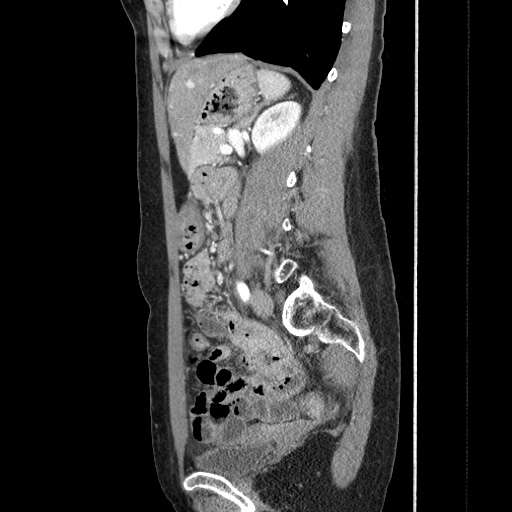

[13 of 36 positions shown; findings below may reference images not displayed]

FINDINGS: Lower chest: No acute abnormality.

Hepatobiliary: No focal liver abnormality is seen. No gallstones,
gallbladder wall thickening, or biliary dilatation.

Pancreas: Unremarkable. No pancreatic ductal dilatation or
surrounding inflammatory changes.

Spleen: Normal in size without focal abnormality.

Adrenals/Urinary Tract: Adrenal glands are unremarkable. Kidneys are
normal, without renal calculi, focal lesion, or hydronephrosis.
Bladder is unremarkable.

Stomach/Bowel: Stomach is within normal limits. Dilated appendix
with mucosal thickening and periappendiceal inflammatory changes
most consistent with acute appendicitis. No evidence of bowel wall
thickening, distention, or inflammatory changes.

Vascular/Lymphatic: No significant vascular findings are present. No
enlarged abdominal or pelvic lymph nodes.

Reproductive: Uterus is normal. 3.5 cm hypodense, fluid attenuating
right adnexal mass most consistent with an ovarian cyst.

Other: No abdominal wall hernia or abnormality. No abdominopelvic
ascites.

Musculoskeletal: No acute or significant osseous findings.
IMPRESSION: 1. Dilated appendix with mucosal thickening and periappendiceal
inflammatory changes most consistent with acute appendicitis.

## 2018-06-25 ENCOUNTER — Other Ambulatory Visit: Payer: Self-pay | Admitting: Internal Medicine

## 2018-06-25 ENCOUNTER — Other Ambulatory Visit (HOSPITAL_COMMUNITY)
Admission: RE | Admit: 2018-06-25 | Discharge: 2018-06-25 | Disposition: A | Discharge status: Home / Self Care | Payer: 59 | Source: Ambulatory Visit | Attending: Internal Medicine | Admitting: Internal Medicine

## 2018-06-25 DIAGNOSIS — Z01419 Encounter for gynecological examination (general) (routine) without abnormal findings: Secondary | ICD-10-CM | POA: Insufficient documentation

## 2018-06-25 DIAGNOSIS — N83201 Unspecified ovarian cyst, right side: Secondary | ICD-10-CM

## 2018-06-26 LAB — CYTOLOGY - PAP: Diagnosis: NEGATIVE

## 2018-07-11 ENCOUNTER — Other Ambulatory Visit: Payer: Self-pay | Admitting: Dermatology

## 2018-07-16 ENCOUNTER — Ambulatory Visit
Admission: RE | Admit: 2018-07-16 | Discharge: 2018-07-16 | Disposition: A | Discharge status: Home / Self Care | Payer: 59 | Source: Ambulatory Visit | Attending: Internal Medicine | Admitting: Internal Medicine

## 2018-07-16 DIAGNOSIS — N83201 Unspecified ovarian cyst, right side: Secondary | ICD-10-CM

## 2018-11-20 ENCOUNTER — Other Ambulatory Visit: Payer: Self-pay | Admitting: Internal Medicine

## 2018-11-20 DIAGNOSIS — Z1231 Encounter for screening mammogram for malignant neoplasm of breast: Secondary | ICD-10-CM

## 2018-11-21 ENCOUNTER — Ambulatory Visit
Admission: RE | Admit: 2018-11-21 | Discharge: 2018-11-21 | Disposition: A | Discharge status: Home / Self Care | Payer: 59 | Source: Ambulatory Visit | Attending: Internal Medicine | Admitting: Internal Medicine

## 2018-11-21 DIAGNOSIS — Z1231 Encounter for screening mammogram for malignant neoplasm of breast: Secondary | ICD-10-CM

## 2019-03-28 ENCOUNTER — Other Ambulatory Visit: Payer: Self-pay | Admitting: Internal Medicine

## 2019-03-28 ENCOUNTER — Ambulatory Visit
Admission: RE | Admit: 2019-03-28 | Discharge: 2019-03-28 | Disposition: A | Discharge status: Home / Self Care | Payer: 59 | Source: Ambulatory Visit | Attending: Internal Medicine | Admitting: Internal Medicine

## 2019-03-28 DIAGNOSIS — R059 Cough, unspecified: Secondary | ICD-10-CM

## 2019-03-28 DIAGNOSIS — R05 Cough: Secondary | ICD-10-CM

## 2020-01-14 ENCOUNTER — Other Ambulatory Visit: Payer: Self-pay | Admitting: Internal Medicine

## 2020-01-14 DIAGNOSIS — Z1231 Encounter for screening mammogram for malignant neoplasm of breast: Secondary | ICD-10-CM

## 2020-01-16 ENCOUNTER — Other Ambulatory Visit: Payer: Self-pay

## 2020-01-16 ENCOUNTER — Ambulatory Visit
Admission: RE | Admit: 2020-01-16 | Discharge: 2020-01-16 | Disposition: A | Discharge status: Home / Self Care | Payer: 59 | Source: Ambulatory Visit | Attending: Internal Medicine | Admitting: Internal Medicine

## 2020-01-16 DIAGNOSIS — Z1231 Encounter for screening mammogram for malignant neoplasm of breast: Secondary | ICD-10-CM

## 2020-01-23 ENCOUNTER — Ambulatory Visit: Payer: 59

## 2020-06-19 ENCOUNTER — Other Ambulatory Visit: Payer: Self-pay | Admitting: Physician Assistant

## 2020-06-19 DIAGNOSIS — M8588 Other specified disorders of bone density and structure, other site: Secondary | ICD-10-CM

## 2020-10-06 ENCOUNTER — Ambulatory Visit: Payer: 59

## 2020-12-30 ENCOUNTER — Other Ambulatory Visit: Payer: Self-pay | Admitting: Internal Medicine

## 2020-12-30 DIAGNOSIS — Z1231 Encounter for screening mammogram for malignant neoplasm of breast: Secondary | ICD-10-CM

## 2021-01-12 ENCOUNTER — Other Ambulatory Visit: Payer: Self-pay | Admitting: Physician Assistant

## 2021-01-12 DIAGNOSIS — M8588 Other specified disorders of bone density and structure, other site: Secondary | ICD-10-CM

## 2021-01-13 ENCOUNTER — Other Ambulatory Visit: Payer: 59

## 2021-01-19 ENCOUNTER — Ambulatory Visit
Admission: RE | Admit: 2021-01-19 | Discharge: 2021-01-19 | Disposition: A | Discharge status: Home / Self Care | Payer: 59 | Source: Ambulatory Visit | Attending: Internal Medicine | Admitting: Internal Medicine

## 2021-01-19 ENCOUNTER — Other Ambulatory Visit: Payer: Self-pay

## 2021-01-19 DIAGNOSIS — Z1231 Encounter for screening mammogram for malignant neoplasm of breast: Secondary | ICD-10-CM

## 2021-06-22 ENCOUNTER — Other Ambulatory Visit: Payer: Self-pay

## 2021-06-22 ENCOUNTER — Ambulatory Visit
Admission: RE | Admit: 2021-06-22 | Discharge: 2021-06-22 | Disposition: A | Discharge status: Home / Self Care | Payer: 59 | Source: Ambulatory Visit | Attending: Physician Assistant | Admitting: Physician Assistant

## 2021-06-22 DIAGNOSIS — M8588 Other specified disorders of bone density and structure, other site: Secondary | ICD-10-CM

## 2022-03-10 ENCOUNTER — Other Ambulatory Visit: Payer: Self-pay | Admitting: Internal Medicine

## 2022-03-10 DIAGNOSIS — Z1231 Encounter for screening mammogram for malignant neoplasm of breast: Secondary | ICD-10-CM

## 2022-03-11 ENCOUNTER — Other Ambulatory Visit: Payer: Self-pay | Admitting: Physician Assistant

## 2022-03-11 ENCOUNTER — Ambulatory Visit
Admission: RE | Admit: 2022-03-11 | Discharge: 2022-03-11 | Disposition: A | Discharge status: Home / Self Care | Payer: 59 | Source: Ambulatory Visit | Attending: Physician Assistant | Admitting: Physician Assistant

## 2022-03-11 DIAGNOSIS — R053 Chronic cough: Secondary | ICD-10-CM

## 2022-03-17 ENCOUNTER — Ambulatory Visit
Admission: RE | Admit: 2022-03-17 | Discharge: 2022-03-17 | Disposition: A | Discharge status: Home / Self Care | Payer: 59 | Source: Ambulatory Visit | Attending: Internal Medicine | Admitting: Internal Medicine

## 2022-03-17 DIAGNOSIS — Z1231 Encounter for screening mammogram for malignant neoplasm of breast: Secondary | ICD-10-CM

## 2023-01-20 ENCOUNTER — Ambulatory Visit (INDEPENDENT_AMBULATORY_CARE_PROVIDER_SITE_OTHER): Payer: 59

## 2023-01-20 ENCOUNTER — Encounter: Payer: Self-pay | Admitting: Podiatry

## 2023-01-20 ENCOUNTER — Ambulatory Visit (INDEPENDENT_AMBULATORY_CARE_PROVIDER_SITE_OTHER): Payer: 59 | Admitting: Podiatry

## 2023-01-20 DIAGNOSIS — M19071 Primary osteoarthritis, right ankle and foot: Secondary | ICD-10-CM

## 2023-01-20 DIAGNOSIS — M25571 Pain in right ankle and joints of right foot: Secondary | ICD-10-CM

## 2023-01-20 MED ORDER — MELOXICAM 15 MG PO TABS: 15.0000 mg | ORAL_TABLET | Freq: Every day | ORAL | 0 refills | Status: DC

## 2023-01-20 NOTE — Progress Notes (Signed)
  Subjective:  Patient ID: Allison Hernandez, female    DOB: 06/25/1956,   MRN: [id number]  Chief Complaint  Patient presents with   Ankle Pain    Right ankle pain     67 y.o. female presents for concern of right ankle pain that has been going on for 6 months. Relates it is mostly painful after longer periods of walking. She is trying to get into more activity and the ankle has limited her.   She relates icing and elevating after being on her feet and the ankle starting to hurt.  . Denies any other pedal complaints. Denies n/v/f/c.   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 CFT <3 seconds. Normal hair growth on digits. No edema.  Skin. No lacerations or abrasions bilateral feet.  Musculoskeletal: MMT 5/5 bilateral lower extremities in DF, PF, Inversion and Eversion. Deceased ROM in DF of ankle joint.  Tender over anterior lateral ankle joint line. Minimal pain with ROM of the ankle. Relates its more painful after longer distances. No pain with STJ Rom not pain elsewhere about the foot or ankle.  Neurological: Sensation intact to light touch.   Assessment:   1. Arthritis of ankle, right      Plan:  Patient was evaluated and treated and all questions answered. Reviewed notes from previous physician.  Discussed ankle arthritis with patient and treatment options.  X-rays reviewed. No acute fractures or dislocations. Degenerative changes noted to the ankle with narrowing of the tibiotalar joint and some spurring noted.  Discussed NSAIDS, topicals, and possible  injections.  Prescription for meloxicam provided. Discussed supportive shoes and bracing.  Tri lock brace dispensed.  Discussed if pain does not improve can discuss injection and potentially need for surgery.  Patient to follow-up as needed.    Lorenda Peck, DPM

## 2023-02-02 ENCOUNTER — Other Ambulatory Visit: Payer: Self-pay | Admitting: Internal Medicine

## 2023-02-02 DIAGNOSIS — Z1231 Encounter for screening mammogram for malignant neoplasm of breast: Secondary | ICD-10-CM

## 2023-04-18 ENCOUNTER — Ambulatory Visit
Admission: RE | Admit: 2023-04-18 | Discharge: 2023-04-18 | Disposition: A | Discharge status: Home / Self Care | Payer: 59 | Source: Ambulatory Visit | Attending: Internal Medicine | Admitting: Internal Medicine

## 2023-04-18 DIAGNOSIS — Z1231 Encounter for screening mammogram for malignant neoplasm of breast: Secondary | ICD-10-CM

## 2023-07-26 ENCOUNTER — Other Ambulatory Visit: Payer: Self-pay | Admitting: Physician Assistant

## 2023-07-26 DIAGNOSIS — Z1231 Encounter for screening mammogram for malignant neoplasm of breast: Secondary | ICD-10-CM

## 2023-07-27 ENCOUNTER — Other Ambulatory Visit: Payer: Self-pay | Admitting: Physician Assistant

## 2023-07-27 DIAGNOSIS — M810 Age-related osteoporosis without current pathological fracture: Secondary | ICD-10-CM

## 2024-04-18 ENCOUNTER — Other Ambulatory Visit: Payer: 59

## 2024-04-18 ENCOUNTER — Ambulatory Visit
Admission: RE | Admit: 2024-04-18 | Discharge: 2024-04-18 | Disposition: A | Discharge status: Home / Self Care | Payer: 59 | Source: Ambulatory Visit | Attending: Physician Assistant | Admitting: Physician Assistant

## 2024-04-18 DIAGNOSIS — Z1231 Encounter for screening mammogram for malignant neoplasm of breast: Secondary | ICD-10-CM

## 2024-04-24 ENCOUNTER — Ambulatory Visit

## 2024-04-24 DIAGNOSIS — Z780 Asymptomatic menopausal state: Secondary | ICD-10-CM

## 2024-04-24 DIAGNOSIS — M810 Age-related osteoporosis without current pathological fracture: Secondary | ICD-10-CM

## 2024-09-30 ENCOUNTER — Encounter: Payer: Self-pay | Admitting: *Deleted

## 2024-10-01 ENCOUNTER — Ambulatory Visit (INDEPENDENT_AMBULATORY_CARE_PROVIDER_SITE_OTHER): Admitting: Neurology

## 2024-10-01 ENCOUNTER — Encounter: Payer: Self-pay | Admitting: Neurology

## 2024-10-01 VITALS — BP 133/66 | HR 102 | Ht 66.0 in | Wt 126.0 lb

## 2024-10-01 DIAGNOSIS — R253 Fasciculation: Secondary | ICD-10-CM

## 2024-10-01 DIAGNOSIS — R202 Paresthesia of skin: Secondary | ICD-10-CM

## 2024-10-01 MED ORDER — GABAPENTIN 100 MG PO CAPS: 300.0000 mg | ORAL_CAPSULE | Freq: Every day | ORAL | 6 refills | Status: AC

## 2024-10-01 NOTE — Progress Notes (Unsigned)
 Chief Complaint  Patient presents with   New Patient (Initial Visit)    Pt in room 14.  Alone. proficient paper referral for hands and feet tingling, muscle twitching, fine motor movements affected.      ASSESSMENT AND PLAN  Allison Hernandez is a 68 y.o. female   Constellation of complaints  Transient muscle twitching, paresthesia,  Normal neurological examination, functioning well in daily activity including workout, well-preserved deep tendon reflex,  Inflammatory markers to rule out systemic inflammatory disorder  Gabapentin  100 mg titrating to 300 mg every night  Above symptoms happened in the setting of major life event change, anxiety chronic insomnia, which can certainly contribute to her complains,  suggested her continue to work with her primary care  DIAGNOSTIC DATA (LABS, IMAGING, TESTING) - I reviewed patient records, labs, notes, testing and imaging myself where available.   MEDICAL HISTORY:  Allison Hernandez is a 68 year old female, seen in request by her primary care from [facility] Dr. Dwight, Tr[place] for evaluation of constellation of complaints,  History is obtained from the patient and review of electronic medical records. I personally reviewed pertinent available imaging films in PACS.   PMHx of  Hx of right breast cancer in 2003, s/p lobectomy, radiation, chemotherapy Osteopenia  She went through major life changing event, retired around March, taking care of her 69 year old-year-old mother, who recently suffered a fracture,  Around March 2025, she began to notice intermittent twitching involving her hands and feet, calf, transient, only few seconds  Later she also noticed tingling sensation at occipital across her shoulder, intermittent itching  She felt heaviness in her body and limbs, but there was no limitation in her activity, she walk 30 minutes, does regular floor exercise with not any difficulties  She also complains of transient numbness in her toes,  crawling sensation in her neck sometimes, clumsiness of her hands,   Lab in Sept 2025, CMP creat 1.07, GFR 56, TSH, B12 656, CPK 130  PHYSICAL EXAM:   Vitals:   10/01/24 0929  BP: 133/66  Pulse: (!) 102  Weight: 126 lb (57.2 kg)  Height: 5' 6 (1.676 m)   Body mass index is 20.34 kg/m.  PHYSICAL EXAMNIATION:  Gen: NAD, conversant, well nourised, well groomed                     Cardiovascular: Regular rate rhythm, no peripheral edema, warm, nontender. Eyes: Conjunctivae clear without exudates or hemorrhage Neck: Supple, no carotid bruits. Pulmonary: Clear to auscultation bilaterally   NEUROLOGICAL EXAM:  MENTAL STATUS: Speech/cognition: Awake, alert, oriented to history taking and casual conversation CRANIAL NERVES: CN II: Visual fields are full to confrontation. Pupils are round equal and briskly reactive to light. CN III, IV, VI: extraocular movement are normal. No ptosis. CN V: Facial sensation is intact to light touch CN VII: Face is symmetric with normal eye closure  CN VIII: Hearing is normal to causal conversation. CN IX, X: Phonation is normal. CN XI: Head turning and shoulder shrug are intact  MOTOR: There is no pronator drift of out-stretched arms. Muscle bulk and tone are normal. Muscle strength is normal.  REFLEXES: Reflexes are 2+ and symmetric at the biceps, triceps, knees, and ankles. Plantar responses are flexor.  SENSORY: Intact to light touch, pinprick and vibratory sensation are intact in fingers and toes.  COORDINATION: There is no trunk or limb dysmetria noted.  GAIT/STANCE: Posture is normal. Gait is steady with normal steps, base, arm swing, and  turning. Heel and toe walking are normal. Tandem gait is normal.  Romberg is absent.  REVIEW OF SYSTEMS:  Full 14 system review of systems performed and notable only for as above All other review of systems were negative.   ALLERGIES: Allergies  Allergen Reactions   Chamomile Itching    Benadryl [Diphenhydramine Hcl] Hives    Cannot tolerate in high doses    HOME MEDICATIONS: Current Outpatient Medications  Medication Sig Dispense Refill   Calcium Carbonate-Vit D-Min (CALCIUM 1200 PO) Take 1 tablet by mouth daily.      cholecalciferol (VITAMIN D ) 1000 UNITS tablet Take 1,000 Units by mouth daily.     MULTIPLE VITAMIN PO Take 1 tablet by mouth daily.      Sulfacetamide Sodium-Sulfur 10-5 % LOTN Apply 1 application topically daily.   0   azithromycin  (ZITHROMAX ) 250 MG tablet Take 2 tablets by mouth on day 1, followed by 1 tablet by mouth daily for 4 days. (Patient not taking: Reported on 10/01/2024) 6 tablet 0   benzonatate  (TESSALON ) 100 MG capsule Take 1 capsule (100 mg total) by mouth 3 (three) times daily as needed for cough. (Patient not taking: Reported on 10/01/2024) 21 capsule 0   ibuprofen (ADVIL,MOTRIN) 200 MG tablet Take 200 mg by mouth every 6 (six) hours as needed (for pain). (Patient not taking: Reported on 10/01/2024)     meloxicam  (MOBIC ) 15 MG tablet Take 1 tablet (15 mg total) by mouth daily. (Patient not taking: Reported on 10/01/2024) 30 tablet 0   mometasone  (NASONEX ) 50 MCG/ACT nasal spray 2 sprays each nostril q day (Patient not taking: Reported on 10/01/2024) 17 g 12   oxyCODONE  (OXY IR/ROXICODONE ) 5 MG immediate release tablet Take 1-2 tablets (5-10 mg total) by mouth every 4 (four) hours as needed for moderate pain. (Patient not taking: Reported on 10/01/2024) 30 tablet 0   No current facility-administered medications for this visit.   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   Hypercholesteremia    Menopause    Osteopenia    Personal history of chemotherapy 2003   Right Breast Cancer   Personal history of radiation therapy 2003   Right Breast Cancer   Right ovarian cyst    Rosacea    Thoracic outlet syndrome    Tubular adenoma    Urinary incontinence    Vitamin D  deficiency disease     PAST SURGICAL HISTORY: Past Surgical History:  Procedure Laterality Date   BREAST LUMPECTOMY Right 2003   LAPAROSCOPIC APPENDECTOMY N/A 07/25/2016   Procedure: APPENDECTOMY LAPAROSCOPIC;  Surgeon: Vicenta Poli, MD;  Location: [facility] OR;  Service: General;  Laterality: N/A;   PORT-A-CATH REMOVAL  01/01/2004   Dr Merrilyn   WISDOM TOOTH EXTRACTION     may 2022    FAMILY HISTORY: Family History  Problem Relation Age of Onset   Hyperlipidemia Mother    Heart disease Mother    Diabetes Father    Stroke Father    Hypertension Father  Breast cancer Paternal Aunt    Cancer Paternal Aunt    Stroke Maternal Grandmother    Heart disease Maternal Grandmother    Arthritis Maternal Grandmother    Diabetes Maternal Grandmother     SOCIAL HISTORY: Social History   Socioeconomic History   Marital status: Single    Spouse name: Not on file   Number of children: Not on file   Years of education: Not on file   Highest education level: Not on file  Occupational History   Not on file  Tobacco Use   Smoking status: Never   Smokeless tobacco: Never  Vaping Use   Vaping status: Never Used  Substance and Sexual Activity   Alcohol use: Not Currently   Drug use: No   Sexual activity: Never  Other Topics Concern   Not on file  Social History Narrative   Not on file   Social Drivers of Health   Financial Resource Strain: Not on file  Food Insecurity: Not on file  Transportation Needs: Not on file  Physical Activity: Not on file  Stress: Not on file  Social  Connections: Not on file  Intimate Partner Violence: Not on file    +  Modena Callander, M.D. Ph.D.  [facility] [address], [address] [place], [place] [address] Ph: [phone] Fax: [phone]  CC:  Dwight Trula SQUIBB, MD [address] [place],  [place] [address]  Dwight Trula SQUIBB, MD

## 2024-10-02 ENCOUNTER — Encounter: Payer: Self-pay | Admitting: Neurology

## 2024-10-02 LAB — ANA W/REFLEX IF POSITIVE: Anti Nuclear Antibody (ANA): NEGATIVE

## 2024-10-02 LAB — SEDIMENTATION RATE: Sed Rate: 4 mm/h (ref 0–40)

## 2024-10-02 LAB — C-REACTIVE PROTEIN: CRP: <1 mg/L (ref 0–10)

## 2024-10-03 ENCOUNTER — Ambulatory Visit: Payer: Self-pay | Admitting: Neurology

## 2024-10-25 ENCOUNTER — Other Ambulatory Visit: Payer: Self-pay | Admitting: Nurse Practitioner

## 2024-10-25 DIAGNOSIS — N83201 Unspecified ovarian cyst, right side: Secondary | ICD-10-CM

## 2024-10-28 ENCOUNTER — Telehealth: Payer: Self-pay | Admitting: Neurology

## 2024-10-28 NOTE — Telephone Encounter (Signed)
 Pt said she was told to f/u in 2-3 months, she also asked it be noted that her symptoms are worsening, they are no better, pt has agreed to be placed on wait list as a result of her symptoms worsening. Pt asked this be relayed to RN for Dr Onita

## 2024-10-28 NOTE — Telephone Encounter (Signed)
 Called and spoke to pt who stated that she is only taking 200mg  of gabapentin . Its helping with anxiety but not helping w/bilateral numbness in feet, clumsy legs up to mid thigh, gait issues. Clumsy hands especially dexterity issues. Lip numb. Issues speaking and takes a while to get words out. Pt stated that she would go up to 300mg  after christmas   Routing to work in provider

## 2024-10-30 NOTE — Telephone Encounter (Signed)
 It is ordered for 300mg  po at bedtime.  Pt is taking 200mg  now.  Will see after Christmas if taking 300mg  po at bedtime will improve symptoms.

## 2024-11-06 ENCOUNTER — Ambulatory Visit
Admission: RE | Admit: 2024-11-06 | Discharge: 2024-11-06 | Disposition: A | Discharge status: Home / Self Care | Source: Ambulatory Visit | Attending: Family Medicine | Admitting: Family Medicine

## 2024-11-06 VITALS — BP 137/67 | HR 113 | Temp 100.4°F | Resp 17 | Ht 66.0 in | Wt 124.0 lb

## 2024-11-06 DIAGNOSIS — J111 Influenza due to unidentified influenza virus with other respiratory manifestations: Secondary | ICD-10-CM

## 2024-11-06 DIAGNOSIS — J209 Acute bronchitis, unspecified: Secondary | ICD-10-CM

## 2024-11-06 MED ORDER — PREDNISONE 20 MG PO TABS: 40.0000 mg | ORAL_TABLET | Freq: Every day | ORAL | 0 refills | Status: AC

## 2024-11-06 MED ORDER — AZITHROMYCIN 250 MG PO TABS: ORAL_TABLET | ORAL | 0 refills | Status: AC

## 2024-11-06 NOTE — ED Triage Notes (Signed)
 Pt states that she has a cough and chest congestion. X9 days  Pt states that she was diagnosed with Flu. Pt states that she couldn't take the medications that were prescribed to do contraindications with other medications.   Pt states that she has had a fever. X1 day 100.0.

## 2024-11-06 NOTE — Discharge Instructions (Signed)
 Take the Z-Pak as directed.  2 pills today then 1 a day until gone Take prednisone once a day for 5 days.  This helps take down bronchial inflammation and cough Make sure you are drinking lots of fluids May continue Mucinex See your doctor if not improving by next week

## 2024-11-06 NOTE — ED Provider Notes (Signed)
 " Allison Hernandez    CSN: [id number] Arrival date & time: 11/06/24  1751      History   Chief Complaint Chief Complaint  Patient presents with   Cough    Have had the flu about 9 days.  Fever has returned.  Cannot shake wet chest cough. - Entered by patient    HPI Allison Hernandez is a 68 y.o. female.   Patient states she has been sick with 9 days of cough and chest congestion.  She had a positive flu test.  She chose not to take Tamiflu .  She states that she has been drinking a lot of fluids and taking Mucinex.  Initially she thought she was improved after about a week but for the last 2 days has had a relapse with increased coughing, colored sputum, and fever.  Feeling very tired.  Worried about bronchitis.    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   Hypercholesteremia    Menopause    Osteopenia    Personal history of chemotherapy 2003   Right Breast Cancer   Personal history of radiation therapy 2003   Right Breast Cancer   Right ovarian cyst    Rosacea    Thoracic outlet syndrome    Tubular adenoma    Urinary incontinence    Vitamin D  deficiency disease     Patient Active Problem List   Diagnosis Date Noted   Paresthesia 10/01/2024   Muscle twitching 10/01/2024   Acute appendicitis 07/25/2016   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   Allergic rhinitis 03/12/2015   Colon polyps 11/27/2012   Rosacea 08/18/2012   Menopause 08/16/2012   Osteopenia 08/16/2012   Urge incontinence 08/16/2012   History of thoracic outlet syndrome 08/16/2012   Morton's neuroma 08/16/2012   History of vitamin D  deficiency 08/16/2012   Hx Breast cancer, IDC, Left, Stage II, 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    Past Surgical History:  Procedure Laterality Date    BREAST LUMPECTOMY Right 2003   LAPAROSCOPIC APPENDECTOMY N/A 07/25/2016   Procedure: APPENDECTOMY LAPAROSCOPIC;  Surgeon: Vicenta Poli, MD;  Location: [facility] OR;  Service: General;  Laterality: N/A;   PORT-A-CATH REMOVAL  01/01/2004   Dr Merrilyn   WISDOM TOOTH EXTRACTION     may 2022    OB History     Gravida  0   Para      Term      Preterm      AB      Living  0      SAB      IAB      Ectopic      Multiple      Live Births               Home Medications    Prior to Admission medications  Medication Sig Start Date End Date Taking? Authorizing Provider  azithromycin  (ZITHROMAX  Z-PAK) 250 MG tablet Take two pills today followed by one a day until gone 11/06/24  Yes Maranda Jamee Jacob, MD  Calcium Carbonate-Vit D-Min (CALCIUM 1200 PO) Take 1 tablet by mouth daily.     [provider]  cholecalciferol (VITAMIN D ) 1000 UNITS tablet Take 1,000 Units by mouth daily.  [provider]  gabapentin  (NEURONTIN ) 100 MG capsule Take 3 capsules (300 mg total) by mouth at bedtime. 10/01/24   Yan, Yijun, MD  MULTIPLE VITAMIN PO Take 1 tablet by mouth daily.     [provider]  predniSONE (DELTASONE) 20 MG tablet Take 2 tablets (40 mg total) by mouth daily with breakfast. 11/06/24  Yes Maranda Jamee Jacob, MD  Sulfacetamide Sodium-Sulfur 10-5 % LOTN Apply 1 application topically daily.  02/26/15   [provider]    Family History Family History  Problem Relation Age of Onset   Hyperlipidemia Mother    Heart disease Mother    Diabetes Father    Stroke Father    Hypertension Father    Breast cancer Paternal Aunt    Cancer Paternal Aunt    Stroke Maternal Grandmother    Heart disease Maternal Grandmother    Arthritis Maternal Grandmother    Diabetes Maternal Grandmother     Social History Social History[1]   Allergies   Chamomile and Benadryl [diphenhydramine hcl]   Review of Systems Review of Systems See HPI  Physical  Exam Triage Vital Signs ED Triage Vitals  Encounter Vitals Group     BP 11/06/24 1847 137/67     Girls Systolic BP Percentile --      Girls Diastolic BP Percentile --      Boys Systolic BP Percentile --      Boys Diastolic BP Percentile --      Pulse Rate 11/06/24 1847 (!) 113     Resp 11/06/24 1847 17     Temp 11/06/24 1847 (!) 100.4 F (38 C)     Temp Source 11/06/24 1847 Oral     SpO2 11/06/24 1847 96 %     Weight 11/06/24 1845 124 lb (56.2 kg)     Height 11/06/24 1845 5' 6 (1.676 m)     Head Circumference --      Peak Flow --      Pain Score 11/06/24 1845 0     Pain Loc --      Pain Education --      Exclude from Growth Chart --    No data found.  Updated Vital Signs BP 137/67 (BP Location: Left Arm)   Pulse (!) 113   Temp (!) 100.4 F (38 C) (Oral)   Resp 17   Ht 5' 6 (1.676 m)   Wt 56.2 kg   SpO2 96%   BMI 20.01 kg/m       Physical Exam Constitutional:      General: She is not in acute distress.    Appearance: She is well-developed. She is ill-appearing.  HENT:     Head: Normocephalic and atraumatic.     Right Ear: Tympanic membrane normal.     Left Ear: Tympanic membrane normal.     Nose: No congestion or rhinorrhea.     Mouth/Throat:     Mouth: Mucous membranes are dry.  Eyes:     Conjunctiva/sclera: Conjunctivae normal.     Pupils: Pupils are equal, round, and reactive to light.  Cardiovascular:     Rate and Rhythm: Normal rate and regular rhythm.     Heart sounds: Normal heart sounds.  Pulmonary:     Effort: Pulmonary effort is normal. No respiratory distress.     Breath sounds: Rhonchi present.  Musculoskeletal:        General: Normal range of motion.     Cervical back: Normal range of motion.  Skin:  General: Skin is warm and dry.  Neurological:     Mental Status: She is alert.      UC Treatments / Results  Labs (all labs ordered are listed, but only abnormal results are displayed) Labs Reviewed - No data to  display  EKG   Radiology No results found.  Procedures Procedures (including critical Hernandez time)  Medications Ordered in UC Medications - No data to display  Initial Impression / Assessment and Plan / UC Course  I have reviewed the triage vital signs and the nursing notes.  Pertinent labs & imaging results that were available during my Hernandez of the patient were reviewed by me and considered in my medical decision making (see chart for details).      Final Clinical Impressions(s) / UC Diagnoses   Final diagnoses:  Influenza with respiratory manifestation  Acute bronchitis, unspecified organism     Discharge Instructions      Take the Z-Pak as directed.  2 pills today then 1 a day until gone Take prednisone once a day for 5 days.  This helps take down bronchial inflammation and cough Make sure you are drinking lots of fluids May continue Mucinex See your doctor if not improving by next week   ED Prescriptions     Medication Sig Dispense Auth. Provider   azithromycin  (ZITHROMAX  Z-PAK) 250 MG tablet Take two pills today followed by one a day until gone 6 tablet Maranda Jamee Jacob, MD   predniSONE (DELTASONE) 20 MG tablet Take 2 tablets (40 mg total) by mouth daily with breakfast. 10 tablet Maranda Jamee Jacob, MD      PDMP not reviewed this encounter.    [1]  Social History Tobacco Use   Smoking status: Never   Smokeless tobacco: Never  Vaping Use   Vaping status: Never Used  Substance Use Topics   Alcohol use: Not Currently   Drug use: No     Maranda Jamee Jacob, MD 11/06/24 1907  "

## 2025-01-28 ENCOUNTER — Other Ambulatory Visit

## 2025-02-25 ENCOUNTER — Ambulatory Visit: Admitting: Neurology
